# Patient Record
Sex: Male | Born: 1940 | Race: White | Hispanic: No | Marital: Single | Smoking: Never smoker
Health system: Southern US, Community
[De-identification: ages and names within clinical notes are randomized; demographics above are authoritative.]

## PROBLEM LIST (undated history)

## (undated) DIAGNOSIS — C61 Malignant neoplasm of prostate: Secondary | ICD-10-CM

## (undated) DIAGNOSIS — Z973 Presence of spectacles and contact lenses: Secondary | ICD-10-CM

## (undated) DIAGNOSIS — R399 Unspecified symptoms and signs involving the genitourinary system: Secondary | ICD-10-CM

## (undated) HISTORY — PX: PROSTATE BIOPSY: SHX241

## (undated) HISTORY — DX: Malignant neoplasm of prostate: C61

---

## 2012-06-04 HISTORY — PX: LAPAROSCOPIC INGUINAL HERNIA REPAIR: SUR788

## 2014-10-04 ENCOUNTER — Other Ambulatory Visit (HOSPITAL_COMMUNITY): Payer: Self-pay | Admitting: Urology

## 2014-10-04 DIAGNOSIS — C61 Malignant neoplasm of prostate: Secondary | ICD-10-CM

## 2014-10-12 ENCOUNTER — Encounter (HOSPITAL_COMMUNITY)
Admission: RE | Admit: 2014-10-12 | Discharge: 2014-10-12 | Disposition: A | Discharge status: Home / Self Care | Payer: Medicare Other | Source: Ambulatory Visit | Attending: Urology | Admitting: Urology

## 2014-10-12 DIAGNOSIS — C61 Malignant neoplasm of prostate: Secondary | ICD-10-CM | POA: Diagnosis present

## 2014-10-12 MED ORDER — TECHNETIUM TC 99M MEDRONATE IV KIT
26.5000 | PACK | Freq: Once | INTRAVENOUS | Status: AC | PRN
  Administered 2014-10-12: 26.5 via INTRAVENOUS

## 2014-10-26 ENCOUNTER — Other Ambulatory Visit (HOSPITAL_COMMUNITY): Payer: Self-pay | Admitting: Urology

## 2014-10-26 ENCOUNTER — Ambulatory Visit (HOSPITAL_COMMUNITY)
Admission: RE | Admit: 2014-10-26 | Discharge: 2014-10-26 | Disposition: A | Discharge status: Home / Self Care | Payer: Medicare Other | Source: Ambulatory Visit | Attending: Urology | Admitting: Urology

## 2014-10-26 DIAGNOSIS — C61 Malignant neoplasm of prostate: Secondary | ICD-10-CM

## 2014-11-09 ENCOUNTER — Encounter: Payer: Self-pay | Admitting: Radiation Oncology

## 2014-11-09 NOTE — Progress Notes (Signed)
GU Location of Tumor / Histology: prostatic adenocarcinoma  If Prostate Cancer, Gleason Score is (4 + 3) and PSA is (19.35)  Benjamin Fry presented referred by his PCP, Dr. Sabra Heck, on 09/18/2014 with an elevated PSA.  Biopsies of prostate (if applicable) revealed:    Past/Anticipated interventions by urology, if any: prostate biopsy and referral to radiation oncology  Past/Anticipated interventions by medical oncology, if any: no  Weight changes, if any: no  Bowel/Bladder complaints, if any: urinary frequency, feelings of urinary urgency, and nocturia   Nausea/Vomiting, if any: no  Pain issues, if any:  no  SAFETY ISSUES:  Prior radiation? no  Pacemaker/ICD? no  Possible current pregnancy? no  Is the patient on methotrexate? no  Current Complaints / other details:  74 year old male. Single with two sons. Denies taking any medication at this time. Prostate volume 47 cc.

## 2014-11-11 ENCOUNTER — Ambulatory Visit
Admission: RE | Admit: 2014-11-11 | Discharge: 2014-11-11 | Disposition: A | Discharge status: Home / Self Care | Payer: Medicare Other | Source: Ambulatory Visit | Attending: Radiation Oncology | Admitting: Radiation Oncology

## 2014-11-11 ENCOUNTER — Encounter: Payer: Self-pay | Admitting: Radiation Oncology

## 2014-11-11 VITALS — BP 139/68 | HR 76 | Temp 98.0°F | Resp 16 | Ht 72.0 in | Wt 205.3 lb

## 2014-11-11 DIAGNOSIS — Z823 Family history of stroke: Secondary | ICD-10-CM | POA: Insufficient documentation

## 2014-11-11 DIAGNOSIS — Z510 Encounter for antineoplastic radiation therapy: Secondary | ICD-10-CM | POA: Insufficient documentation

## 2014-11-11 DIAGNOSIS — Z8249 Family history of ischemic heart disease and other diseases of the circulatory system: Secondary | ICD-10-CM | POA: Insufficient documentation

## 2014-11-11 DIAGNOSIS — C61 Malignant neoplasm of prostate: Secondary | ICD-10-CM

## 2014-11-11 DIAGNOSIS — Z806 Family history of leukemia: Secondary | ICD-10-CM | POA: Insufficient documentation

## 2014-11-11 NOTE — Progress Notes (Signed)
Radiation Oncology         [phone] ________________________________  Initial Outpatient Consultation  Name: Benjamin Fry MRN: [id number]  Date: 11/11/2014  DOB: [birth year]  VO:[id number],IHKV F., MD  [name], MD   REFERRING PHYSICIAN: [name], MD  DIAGNOSIS: 74 y.o. gentleman with stage T1c adenocarcinoma of the prostate with a Gleason's score of 4+3 and a PSA of 19.35.   HISTORY OF PRESENT ILLNESS::Benjamin Fry is a 74 y.o. gentleman. This patient was initially dx with prostate cancer in [facility] in 10/14 with elevated PSA of 11.7. He had two core biopsy positive for Gleason 3+4 and 3+3. He decided on active surveillance. He relocated to [facility] and was noted to have an elevated PSA of 19.35 by his primary care physician, Dr. Sabra Heck.  Accordingly, he was referred for evaluation in urology by Dr. Karsten Ro.  A digital rectal examination was performed at that time revealing no nodules.  The patient proceeded to transrectal ultrasound with 12 biopsies of the prostate on 09/23/14.  The prostate volume measured 47.1 cc.  Out of 12 core biopsies, 3 were positive. The maximum Gleason score was 4+3, and this was seen in left medial and lateral ace, with Gleason 3+3 seen in the left lateral mid.   Bone scan as well as CT of the pelvis reveal no evidence of metastasis.  The patient reviewed the biopsy results and scan results with his urologist and he has kindly been referred today for discussion of potential radiation treatment options. Patient has received a Lupron injection May 2016.   PREVIOUS RADIATION THERAPY: NO  PAST MEDICAL HISTORY:  has a past medical history of Prostate cancer and Elevated prostate specific antigen (PSA).    PAST SURGICAL HISTORY: Past Surgical History  Procedure Laterality Date  . Prostate biopsy    . Laparoscopy repair of initial inguinal hernia      FAMILY HISTORY: family history includes CVA in his mother; Heart attack in his father; Leukemia in his  father.  SOCIAL HISTORY:  reports that he has never smoked. He has never used smokeless tobacco. He reports that he drinks alcohol.  ALLERGIES: Review of patient's allergies indicates no known allergies.  MEDICATIONS:  Current Outpatient Prescriptions  Medication Sig Dispense Refill  . naproxen sodium (ANAPROX) 220 MG tablet Take 220 mg by mouth 2 (two) times daily with a meal.     No current facility-administered medications for this encounter.    REVIEW OF SYSTEMS:  A 15 point review of systems is documented in the electronic medical record. This was obtained by the nursing staff. However, I reviewed this with the patient to discuss relevant findings and make appropriate changes.  A comprehensive review of systems was negative..  The patient completed an IPSS and IIEF questionnaire.  His IPSS score was 11 indicating moderate urinary outflow obstructive symptoms.  He indicated that his erectile function is almost always to complete sexual activity.Colonoscopy schedule for December 17, 2014.  IPSS score of 11 with urgency, weak stream and nocturia two times per night.  He has just moved from [facility].  He denies having any pain.  He is interested in having seeds placed. Marland Kitchen    PHYSICAL EXAM: This patient is in no acute distress.  He is alert and oriented.  BP 139/68 mmHg  Pulse 76  Temp(Src) 98 F (36.7 C) (Oral)  Resp 16  Ht 6' (1.829 m)  Wt 205 lb 4.8 oz (93.123 kg)  BMI 27.84 kg/m2 He exhibits no respiratory  distress or labored breathing.  He appears neurologically intact.  His mood is pleasant.  His affect is appropriate.  Please note the digital rectal exam findings described above.  KPS = 100  100 - Normal; no complaints; no evidence of disease. 90   - Able to carry on normal activity; minor signs or symptoms of disease. 80   - Normal activity with effort; some signs or symptoms of disease. 23   - Cares for self; unable to carry on normal activity or to do active work. 60   -  Requires occasional assistance, but is able to care for most of his personal needs. 50   - Requires considerable assistance and frequent medical care. 29   - Disabled; requires special care and assistance. 65   - Severely disabled; hospital admission is indicated although death not imminent. 80   - Very sick; hospital admission necessary; active supportive treatment necessary. 10   - Moribund; fatal processes progressing rapidly. 0     - Dead  Karnofsky DA, Abelmann WH, Craver LS and Burchenal JH [phone]) The use of the nitrogen mustards in the palliative treatment of carcinoma: with particular reference to bronchogenic carcinoma Cancer 1 634-56   LABORATORY DATA:  No results found for: WBC, HGB, HCT, MCV, PLT No results found for: NA, K, CL, CO2 No results found for: ALT, AST, GGT, ALKPHOS, BILITOT   RADIOGRAPHY: Dg Cervical Spine 2 Or 3 Views  10/26/2014   CLINICAL DATA:  Prostate neoplasm.  EXAM: CERVICAL SPINE - 2-3 VIEW  COMPARISON:  Bone scan 10/12/2014.  FINDINGS: Diffuse multilevel degenerative changes noted throughout the cervical spine. This most likely accounts for abnormal bone scan activity. No acute or focal lesion identified.  IMPRESSION: Diffuse cervical spine degenerative change. This most likely accounts for abnormal bone scan activity on bone scan of 10/12/2014.   Electronically Signed   By: Marcello Moores  Register   On: 10/26/2014 13:55   Dg Thoracic Spine 2 View  10/26/2014   CLINICAL DATA:  Prostate neoplasm.  EXAM: THORACIC SPINE - 2 VIEW  COMPARISON:  Bone scan 10/12/2014.  FINDINGS: Diffuse multilevel degenerative change noted throughout the thoracic spine. Pedicles are intact. No focal or acute abnormality identified.  IMPRESSION: Diffuse degenerative changes thoracic spine. This most likely accounts for abnormal bone scan activity on recent bone scan of 10/12/2014.   Electronically Signed   By: Marcello Moores  Register   On: 10/26/2014 13:56   Dg Lumbar Spine 2-3 Views  10/26/2014    CLINICAL DATA:  Prostate carcinoma.  EXAM: LUMBAR SPINE - 2-3 VIEW  COMPARISON:  Whole-body bone scan Oct 12, 2014  FINDINGS: Frontal and lateral views were obtained. There are 5 non-rib-bearing lumbar type vertebral bodies. There is no fracture or spondylolisthesis. There is moderately severe disc space narrowing at L5-S1. There is moderate narrowing at all other levels. There are anterior osteophytes at all levels. There are no blastic or lytic bone lesions.  IMPRESSION: Multilevel osteoarthritic change. No blastic or lytic bone lesions. No fracture or spondylolisthesis.   Electronically Signed   By: Lowella Grip III M.D.   On: 10/26/2014 13:57      IMPRESSION: This gentleman is a nice stage T1c adenocarcinoma of the prostate with a Gleason's score of 4+3 and a PSA of 19.35. His T-Stage, Gleason's Score, and PSA put him into the intermediate risk group.  Accordingly he is eligible for a variety of potential treatment options including androgen deprivation with radiotherapy +/- seed implant.  PLAN: Today  I reviewed the findings and workup thus far.  We discussed the natural history of prostate cancer.  We reviewed the the implications of T-stage, Gleason's Score, and PSA on decision-making and outcomes in prostate cancer.  We discussed radiation treatment in the management of prostate cancer with regard to the logistics and delivery of external beam radiation treatment as well as the logistics and delivery of prostate brachytherapy.  We compared and contrasted each of these approaches and also compared these against prostatectomy.  The patient expressed interest in external beam radiotherapy.  I filled out a patient counseling form for him with relevant treatment diagrams and we retained a copy for our records.   The patient would like to proceed with prostate external radiation followed by prostate seed implant boost.  I will share my findings with Dr. Karsten Ro and move forward with scheduling placement  of three gold fiducial markers into the prostate in late July to proceed with external radiation following 2 months of neo-adjuvant AndroGen and deprivation. External beam radiation treatment will be followed by prostate seed implant boost.  I enjoyed meeting with him today, and will look forward to participating in the care of this very nice gentleman.  This document serves as a record of services personally performed by Tyler Pita, MD. It was created on his behalf by Jeralene Peters, a trained medical scribe. The creation of this record is based on the scribe's personal observations and the provider's statements to them. This document has been checked and approved by the attending provider.     I spent 60 minutes face to face with the patient and more than 50% of that time was spent in counseling and/or coordination of care.    ------------------------------------------------  Sheral Apley. Tammi Klippel, M.D.

## 2014-11-11 NOTE — Progress Notes (Addendum)
IPSS score of 11 with urgency, weak stream and nocturia two times per night.  He has just moved from [facility].  He denies having any pain.  He is interested in having seeds placed.  BP 139/68 mmHg  Pulse 76  Temp(Src) 98 F (36.7 C) (Oral)  Resp 16  Ht 6' (1.829 m)  Wt 205 lb 4.8 oz (93.123 kg)  BMI 27.84 kg/m2

## 2014-11-11 NOTE — Progress Notes (Signed)
Please see the Nurse Progress Note in the MD Initial Consult Encounter for this patient. 

## 2014-11-12 ENCOUNTER — Telehealth: Payer: Self-pay | Admitting: Radiation Oncology

## 2014-11-12 NOTE — Telephone Encounter (Signed)
-----   Message from Tyler Pita, MD sent at 11/12/2014 12:34 PM EDT ----- Regarding: RE: Patient confused on time frame Contact: [phone] Sam,  Can you call and clarify 8 weeks from Lupron (5/26) to start radiation, but, simulation is a week or so before radiation and gold markers are 2-3 days before that.  With Lupron going, the disease is dormant, so, longer intervals are OK.  But, we try not to have a shorter interval than 8 weeks from Lupron to radiation.  Also, about 3 week delay between external radiation and seed implant.  MM     ----- Message -----    From: Hollace Kinnier    Sent: 11/12/2014   8:15 AM      To: Tyler Pita, MD Subject: Patient confused on time frame                 Good morning,  Joelyn Oms called first thing this morning saying that he had a discrepency in his notes on if you told him yesterday that there was a five week period vs. Six week period of waiting. I looked at your consult note, but could not find any answer to his question on the gold seeds.  His number is [phone] if you would like for me to call him with an answer to his question, I'll be glad to.  Cecille Rubin

## 2014-11-12 NOTE — Telephone Encounter (Signed)
Per Dr. Johny Shears order this RN called the patient to clarify. Explained that radiation will start 8 weeks following Lupron (which he received on 5/26) but, simulation is a week or so before and gold markers are 2-3 days before that. Went on to explain that the disease is dormant so longer intervals are OK but, we try not to have shorter intervals than 8 weeks from Lupron to radiation. Also, explained the interval from external radiation and seeds implants is about three weeks. Patient very talkative. Reassured patient his "treatments" would be scheduled appropriately and his care well managed. Patient verbalized understanding.

## 2014-11-15 ENCOUNTER — Telehealth: Payer: Self-pay | Admitting: *Deleted

## 2014-11-15 NOTE — Telephone Encounter (Signed)
Called patient to inform of gold seed placement on 12-30-14- arrival time - 1:45 pm @ Dr. Simone Curia Office and his sim on 01-14-15 @ 2 pm @ Dr. Johny Shears Office, spoke with patient and he is aware of these appts.

## 2014-11-16 ENCOUNTER — Telehealth: Payer: Self-pay | Admitting: *Deleted

## 2014-11-16 NOTE — Telephone Encounter (Signed)
CALLED PATIENT TO INFORM THAT HE WILL START RT ON 01-25-15 PER SIM, LVM FOR A RETURN CALL

## 2014-12-09 ENCOUNTER — Telehealth: Payer: Self-pay | Admitting: *Deleted

## 2014-12-09 NOTE — Telephone Encounter (Signed)
Called patient to inform of new date for gold seeds on 01/12/15 - arrival time - 9:45 am  @ the urologist's office, lvm for a return call

## 2015-01-07 ENCOUNTER — Encounter: Payer: Self-pay | Admitting: *Deleted

## 2015-01-10 ENCOUNTER — Ambulatory Visit: Payer: Medicare Other | Admitting: Anesthesiology

## 2015-01-10 ENCOUNTER — Encounter
Admission: RE | Disposition: A | Discharge status: Home / Self Care | Payer: Self-pay | Source: Ambulatory Visit | Attending: Unknown Physician Specialty

## 2015-01-10 ENCOUNTER — Ambulatory Visit
Admission: RE | Admit: 2015-01-10 | Discharge: 2015-01-10 | Disposition: A | Discharge status: Home / Self Care | Payer: Medicare Other | Source: Ambulatory Visit | Attending: Unknown Physician Specialty | Admitting: Unknown Physician Specialty

## 2015-01-10 DIAGNOSIS — Z8546 Personal history of malignant neoplasm of prostate: Secondary | ICD-10-CM | POA: Insufficient documentation

## 2015-01-10 DIAGNOSIS — K5730 Diverticulosis of large intestine without perforation or abscess without bleeding: Secondary | ICD-10-CM | POA: Diagnosis not present

## 2015-01-10 DIAGNOSIS — D120 Benign neoplasm of cecum: Secondary | ICD-10-CM | POA: Insufficient documentation

## 2015-01-10 DIAGNOSIS — K648 Other hemorrhoids: Secondary | ICD-10-CM | POA: Insufficient documentation

## 2015-01-10 DIAGNOSIS — Z1211 Encounter for screening for malignant neoplasm of colon: Secondary | ICD-10-CM | POA: Insufficient documentation

## 2015-01-10 HISTORY — PX: COLONOSCOPY WITH PROPOFOL: SHX5780

## 2015-01-10 SURGERY — COLONOSCOPY WITH PROPOFOL
Anesthesia: General

## 2015-01-10 MED ORDER — FENTANYL CITRATE (PF) 100 MCG/2ML IJ SOLN
INTRAMUSCULAR | Status: DC | PRN
  Administered 2015-01-10: 50 ug via INTRAVENOUS

## 2015-01-10 MED ORDER — LIDOCAINE HCL (PF) 2 % IJ SOLN
INTRAMUSCULAR | Status: DC | PRN
  Administered 2015-01-10: 50 mg

## 2015-01-10 MED ORDER — PROPOFOL 10 MG/ML IV BOLUS
INTRAVENOUS | Status: DC | PRN
  Administered 2015-01-10: 50 mg via INTRAVENOUS

## 2015-01-10 MED ORDER — PROPOFOL INFUSION 10 MG/ML OPTIME
INTRAVENOUS | Status: DC | PRN
  Administered 2015-01-10: 100 ug/kg/min via INTRAVENOUS

## 2015-01-10 MED ORDER — SODIUM CHLORIDE 0.9 % IV SOLN
INTRAVENOUS | Status: DC
  Administered 2015-01-10: 12:00:00 via INTRAVENOUS

## 2015-01-10 MED ORDER — SODIUM CHLORIDE 0.9 % IV SOLN
INTRAVENOUS | Status: DC
  Administered 2015-01-10: 1000 mL via INTRAVENOUS

## 2015-01-10 NOTE — H&P (Signed)
Primary Care Physician:  [name], MD Primary Gastroenterologist:  Dr. Vira Agar  Pre-Procedure History & Physical: HPI:  Benjamin Fry is a 74 y.o. male is here for an colonoscopy.   Past Medical History  Diagnosis Date  . Prostate cancer   . Elevated prostate specific antigen (PSA)   . Prostate cancer     Past Surgical History  Procedure Laterality Date  . Prostate biopsy    . Laparoscopy repair of initial inguinal hernia    . Hernia repair      Prior to Admission medications   Medication Sig Start Date End Date Taking? Authorizing Provider  naproxen sodium (ANAPROX) 220 MG tablet Take 220 mg by mouth 2 (two) times daily with a meal.    Historical Provider, MD    Allergies as of 11/24/2014  . (No Known Allergies)    Family History  Problem Relation Age of Onset  . CVA Mother   . Heart attack Father   . Leukemia Father     History   Social History  . Marital Status: Single    Spouse Name: N/A  . Number of Children: 2  . Years of Education: N/A   Occupational History  . Radio and TV    Social History Main Topics  . Smoking status: Never Smoker   . Smokeless tobacco: Never Used  . Alcohol Use: Yes     Comment: 2 per month  . Drug Use: Not on file  . Sexual Activity: No   Other Topics Concern  . Not on file   Social History Narrative    Review of Systems: See HPI, otherwise negative ROS  Physical Exam: BP 117/77 mmHg  Pulse 94  Temp(Src) 97.4 F (36.3 C) (Tympanic)  Resp 16  Ht 6' (1.829 m)  Wt 86.183 kg (190 lb)  BMI 25.76 kg/m2  SpO2 99% General:   Alert,  pleasant and cooperative in NAD Head:  Normocephalic and atraumatic. Neck:  Supple; no masses or thyromegaly. Lungs:  Clear throughout to auscultation.    Heart:  Regular rate and rhythm. Abdomen:  Soft, nontender and nondistended. Normal bowel sounds, without guarding, and without rebound.   Neurologic:  Alert and  oriented x4;  grossly normal  neurologically.  Impression/Plan: Oneil Behney is here for an colonoscopy to be performed for screening  Risks, benefits, limitations, and alternatives regarding  colonoscopy have been reviewed with the patient.  Questions have been answered.  All parties agreeable.   Gaylyn Cheers, MD  01/10/2015, 11:42 AM   Primary Care Physician:  [name], MD Primary Gastroenterologist:  Dr. Vira Agar  Pre-Procedure History & Physical: HPI:  Benjamin Fry is a 74 y.o. male is here for an colonoscopy.   Past Medical History  Diagnosis Date  . Prostate cancer   . Elevated prostate specific antigen (PSA)   . Prostate cancer     Past Surgical History  Procedure Laterality Date  . Prostate biopsy    . Laparoscopy repair of initial inguinal hernia    . Hernia repair      Prior to Admission medications   Medication Sig Start Date End Date Taking? Authorizing Provider  naproxen sodium (ANAPROX) 220 MG tablet Take 220 mg by mouth 2 (two) times daily with a meal.    Historical Provider, MD    Allergies as of 11/24/2014  . (No Known Allergies)    Family History  Problem Relation Age of Onset  . CVA Mother   . Heart attack Father   .  Leukemia Father     History   Social History  . Marital Status: Single    Spouse Name: N/A  . Number of Children: 2  . Years of Education: N/A   Occupational History  . Radio and TV    Social History Main Topics  . Smoking status: Never Smoker   . Smokeless tobacco: Never Used  . Alcohol Use: Yes     Comment: 2 per month  . Drug Use: Not on file  . Sexual Activity: No   Other Topics Concern  . Not on file   Social History Narrative    Review of Systems: See HPI, otherwise negative ROS  Physical Exam: BP 117/77 mmHg  Pulse 94  Temp(Src) 97.4 F (36.3 C) (Tympanic)  Resp 16  Ht 6' (1.829 m)  Wt 86.183 kg (190 lb)  BMI 25.76 kg/m2  SpO2 99% General:   Alert,  pleasant and cooperative in NAD Head:  Normocephalic and  atraumatic. Neck:  Supple; no masses or thyromegaly. Lungs:  Clear throughout to auscultation.    Heart:  Regular rate and rhythm. Abdomen:  Soft, nontender and nondistended. Normal bowel sounds, without guarding, and without rebound.   Neurologic:  Alert and  oriented x4;  grossly normal neurologically.  Impression/Plan: Benjamin Fry is here for an colonoscopy to be performed for screening  Risks, benefits, limitations, and alternatives regarding  colonoscopy have been reviewed with the patient.  Questions have been answered.  All parties agreeable.   Gaylyn Cheers, MD  01/10/2015, 11:42 AM

## 2015-01-10 NOTE — Anesthesia Postprocedure Evaluation (Signed)
  Anesthesia Post-op Note  Patient: Benjamin Fry  Procedure(s) Performed: Procedure(s): COLONOSCOPY WITH PROPOFOL (N/A)  Anesthesia type:General  Patient location: PACU  Post pain: Pain level controlled  Post assessment: Post-op Vital signs reviewed, Patient's Cardiovascular Status Stable, Respiratory Function Stable, Patent Airway and No signs of Nausea or vomiting  Post vital signs: Reviewed and stable  Last Vitals:  Filed Vitals:   01/10/15 1250  BP: 114/71  Pulse: 64  Temp:   Resp: 14    Level of consciousness: awake, alert  and patient cooperative  Complications: No apparent anesthesia complications

## 2015-01-10 NOTE — Anesthesia Preprocedure Evaluation (Addendum)
Anesthesia Evaluation  Patient identified by MRN, date of birth, ID band Patient awake    Reviewed: Allergy & Precautions, H&P , NPO status , Patient's Chart, lab work & pertinent test results, reviewed documented beta blocker date and time   History of Anesthesia Complications Negative for: history of anesthetic complications  Airway Mallampati: II  TM Distance: >3 FB Neck ROM: full    Dental no notable dental hx.    Pulmonary neg pulmonary ROS,  breath sounds clear to auscultation  Pulmonary exam normal       Cardiovascular Exercise Tolerance: Good negative cardio ROS Normal cardiovascular examRhythm:regular Rate:Normal     Neuro/Psych negative neurological ROS  negative psych ROS   GI/Hepatic negative GI ROS, Neg liver ROS,   Endo/Other  negative endocrine ROS  Renal/GU negative Renal ROS  negative genitourinary   Musculoskeletal   Abdominal   Peds  Hematology negative hematology ROS (+)   Anesthesia Other Findings Past Medical History:   Prostate cancer                                              Elevated prostate specific antigen (PSA)                     Prostate cancer                                              Reproductive/Obstetrics negative OB ROS                            Anesthesia Physical Anesthesia Plan  ASA: I  Anesthesia Plan: General   Post-op Pain Management:    Induction:   Airway Management Planned:   Additional Equipment:   Intra-op Plan:   Post-operative Plan:   Informed Consent: I have reviewed the patients History and Physical, chart, labs and discussed the procedure including the risks, benefits and alternatives for the proposed anesthesia with the patient or authorized representative who has indicated his/her understanding and acceptance.   Dental Advisory Given  Plan Discussed with: Anesthesiologist, CRNA and Surgeon  Anesthesia Plan  Comments:         Anesthesia Quick Evaluation

## 2015-01-10 NOTE — Op Note (Addendum)
[facility] Gastroenterology Patient Name: Benjamin Fry Procedure Date: 01/10/2015 11:41 AM MRN: [id number] Account #: [id number] Date of Birth: 1940/11/12 Admit Type: Outpatient Age: 74 Room: [facility] ENDO ROOM 1 Gender: Male Note Status: Supervisor Override Procedure:         Colonoscopy Indications:       Screening for colorectal malignant neoplasm Providers:         Manya Silvas, MD Referring MD:      Rusty Aus, MD (Referring MD) Medicines:         Propofol per Anesthesia Complications:     No immediate complications. Procedure:         Pre-Anesthesia Assessment:                    - After reviewing the risks and benefits, the patient was                     deemed in satisfactory condition to undergo the procedure.                    After obtaining informed consent, the colonoscope was                     passed under direct vision. Throughout the procedure, the                     patient's blood pressure, pulse, and oxygen saturations                     were monitored continuously. The Colonoscope was                     introduced through the anus and advanced to the the cecum,                     identified by appendiceal orifice and ileocecal valve. The                     colonoscopy was performed without difficulty. The patient                     tolerated the procedure well. The quality of the bowel                     preparation was good. Findings:      A diminutive polyp was found in the cecum. The polyp was sessile. The       polyp was removed with a jumbo cold forceps. Resection and retrieval       were complete.      A few small-mouthed diverticula were found in the sigmoid colon.      Internal hemorrhoids were found during endoscopy. The hemorrhoids were       medium-sized and Grade I (internal hemorrhoids that do not prolapse).      The exam was otherwise without abnormality. Impression:        - One diminutive polyp in the cecum.  Resected and                     retrieved.                    - Diverticulosis in the sigmoid colon.                    -  Internal hemorrhoids.                    - The examination was otherwise normal. Recommendation:    - Await pathology results. Manya Silvas, MD 01/10/2015 12:12:14 PM This report has been signed electronically. Number of Addenda: 0 Note Initiated On: 01/10/2015 11:41 AM Scope Withdrawal Time: 0 hours 13 minutes 45 seconds  Total Procedure Duration: 0 hours 19 minutes 33 seconds       [facility]

## 2015-01-10 NOTE — Transfer of Care (Signed)
Immediate Anesthesia Transfer of Care Note  Patient: Benjamin Fry  Procedure(s) Performed: Procedure(s): COLONOSCOPY WITH PROPOFOL (N/A)  Patient Location: PACU  Anesthesia Type:General  Level of Consciousness: sedated  Airway & Oxygen Therapy: Patient Spontanous Breathing and Patient connected to nasal cannula oxygen  Post-op Assessment: Report given to RN and Post -op Vital signs reviewed and stable  Post vital signs: Reviewed and stable  Last Vitals:  Filed Vitals:   01/10/15 1043  BP: 117/77  Pulse: 94  Temp: 36.3 C  Resp: 16    Complications: No apparent anesthesia complications

## 2015-01-11 ENCOUNTER — Encounter: Payer: Self-pay | Admitting: Unknown Physician Specialty

## 2015-01-11 LAB — SURGICAL PATHOLOGY

## 2015-01-14 ENCOUNTER — Ambulatory Visit
Admission: RE | Admit: 2015-01-14 | Discharge: 2015-01-14 | Disposition: A | Discharge status: Home / Self Care | Payer: Medicare Other | Source: Ambulatory Visit | Attending: Radiation Oncology | Admitting: Radiation Oncology

## 2015-01-14 DIAGNOSIS — C61 Malignant neoplasm of prostate: Secondary | ICD-10-CM

## 2015-01-14 DIAGNOSIS — Z510 Encounter for antineoplastic radiation therapy: Secondary | ICD-10-CM | POA: Diagnosis not present

## 2015-01-14 NOTE — Progress Notes (Signed)
  Radiation Oncology         [phone]) [phone] ________________________________  Name: Benjamin Fry  MRN: [id number]  Date: 01/14/2015  DOB: 05/15/1941  SIMULATION AND TREATMENT PLANNING NOTE    ICD-9-CM ICD-10-CM   1. Malignant neoplasm of prostate 185 C61     DIAGNOSIS:  74 y.o. gentleman with stage T1c adenocarcinoma of the prostate with a Gleason's score of 4+3 and a PSA of 19.35.  NARRATIVE:  The patient was brought to the [facility].  Identity was confirmed.  All relevant records and images related to the planned course of therapy were reviewed.  The patient freely provided informed written consent to proceed with treatment after reviewing the details related to the planned course of therapy. The consent form was witnessed and verified by the simulation staff.  Then, the patient was set-up in a stable reproducible supine position for radiation therapy.  A vacuum lock pillow device was custom fabricated to position his legs in a reproducible immobilized position.  Then, I performed a urethrogram under sterile conditions to identify the prostatic apex.  CT images were obtained.  Surface markings were placed.  The CT images were loaded into the planning software.  Then the prostate target and avoidance structures including the rectum, bladder, bowel and hips were contoured.  Treatment planning then occurred.  The radiation prescription was entered and confirmed.  A total of 5 complex treatment devices were fabricated including leg mold and 4 MLCs shielding critical structures. I have requested : 3D Simulation  I have requested a DVH of the following structures: rt hip, lt hip, bladder, small bowel, rectum and prostate.  Pubic Arch Study:  The patient was also under evaluation for possible prostate seed implant. His 3-dimensional image study set was obtained in upload to the planning computer. There, on each axial slice, I contoured the prostate gland. Then, using three-dimensional  radiation planning tools I reconstructed the prostate in view of the structures from the transperineal needle pathway to assess for possible pubic arch interference. In doing so, I did not appreciate any pubic arch interference. Also, the patient's prostate volume was estimated based on the drawn structure. The volume was 38 cc correlating with his TRUS volume of 47 cc.  Given the pubic arch appearance and prostate volume, patient remains a good candidate to proceed with prostate seed implant. Today, he freely provided informed written consent to proceed.     PLAN:  The patient will receive 45 Gy in 25 fractions followed by seed implant boost.  This document serves as a record of services personally performed by Tyler Pita, MD. It was created on his behalf by Arlyce Harman, a trained medical scribe. The creation of this record is based on the scribe's personal observations and the provider's statements to them. This document has been checked and approved by the attending provider.    ________________________________  Sheral Apley. Tammi Klippel, M.D.

## 2015-01-17 ENCOUNTER — Telehealth: Payer: Self-pay | Admitting: Radiation Oncology

## 2015-01-17 NOTE — Telephone Encounter (Signed)
Per Dr. Johny Shears order phoned patient to inquire about his questions. Patient is scheduled to begin receiving 25 radiation treatments a week from tomorrow. Patient questions if he would be able to miss treatment September 13,14,15 to go to the [facility] and September 22 and 23 to go to [place], [place]. Explained this RN will ask Dr. Tammi Klippel and return his call. Patient expressed appreciation.

## 2015-01-17 NOTE — Telephone Encounter (Signed)
-----   Message from Tyler Pita, MD sent at 01/17/2015 11:16 AM EDT ----- Regarding: RE: PHONE CALL Tried to call, no answer.  Sam can you help find out what is going on?  MM   ----- Message -----    From: Kerri Perches    Sent: 01/17/2015  10:43 AM      To: Tyler Pita, MD Subject: PHONE CALL                                     Good Morning Dr. Tammi Klippel,   This patient of yours called and he has a question for you.  Would you please call Mr. Bembenek @ [phone] when you get a chance.  Thanks,  [employer]

## 2015-01-17 NOTE — Telephone Encounter (Signed)
Benjamin Fry, He is receiving androgen deprivation, so a few brief interruptions are OK. MM

## 2015-01-18 ENCOUNTER — Telehealth: Payer: Self-pay | Admitting: Radiation Oncology

## 2015-01-18 NOTE — Telephone Encounter (Signed)
Phoned patient per Dr. Johny Shears order. Explained that since he is receiving androgen deprivation a few brief interruptions are OK. Patient expressed understanding. Forwarded message onto [facility] to make them aware that patient will not be present for treatment on September 13, 14, 15, 22, and 23.

## 2015-01-20 DIAGNOSIS — Z510 Encounter for antineoplastic radiation therapy: Secondary | ICD-10-CM | POA: Diagnosis not present

## 2015-01-23 DIAGNOSIS — Z510 Encounter for antineoplastic radiation therapy: Secondary | ICD-10-CM | POA: Diagnosis not present

## 2015-01-25 ENCOUNTER — Ambulatory Visit
Admission: RE | Admit: 2015-01-25 | Discharge: 2015-01-25 | Disposition: A | Discharge status: Home / Self Care | Payer: Medicare Other | Source: Ambulatory Visit | Attending: Radiation Oncology | Admitting: Radiation Oncology

## 2015-01-25 DIAGNOSIS — Z510 Encounter for antineoplastic radiation therapy: Secondary | ICD-10-CM | POA: Diagnosis not present

## 2015-01-26 ENCOUNTER — Ambulatory Visit
Admission: RE | Admit: 2015-01-26 | Discharge: 2015-01-26 | Disposition: A | Discharge status: Home / Self Care | Payer: Medicare Other | Source: Ambulatory Visit | Attending: Radiation Oncology | Admitting: Radiation Oncology

## 2015-01-26 DIAGNOSIS — Z510 Encounter for antineoplastic radiation therapy: Secondary | ICD-10-CM | POA: Diagnosis not present

## 2015-01-27 ENCOUNTER — Ambulatory Visit
Admission: RE | Admit: 2015-01-27 | Discharge: 2015-01-27 | Disposition: A | Discharge status: Home / Self Care | Payer: Medicare Other | Source: Ambulatory Visit | Attending: Radiation Oncology | Admitting: Radiation Oncology

## 2015-01-27 DIAGNOSIS — Z510 Encounter for antineoplastic radiation therapy: Secondary | ICD-10-CM | POA: Diagnosis not present

## 2015-01-28 ENCOUNTER — Ambulatory Visit
Admission: RE | Admit: 2015-01-28 | Discharge: 2015-01-28 | Disposition: A | Discharge status: Home / Self Care | Payer: Medicare Other | Source: Ambulatory Visit | Attending: Radiation Oncology | Admitting: Radiation Oncology

## 2015-01-28 ENCOUNTER — Encounter: Payer: Self-pay | Admitting: Radiation Oncology

## 2015-01-28 VITALS — BP 146/82 | HR 82 | Resp 16 | Wt 206.4 lb

## 2015-01-28 DIAGNOSIS — C61 Malignant neoplasm of prostate: Secondary | ICD-10-CM

## 2015-01-28 DIAGNOSIS — Z510 Encounter for antineoplastic radiation therapy: Secondary | ICD-10-CM | POA: Diagnosis not present

## 2015-01-28 NOTE — Addendum Note (Signed)
Encounter addended by: Heywood Footman, RN on: 01/28/2015  2:45 PM<BR>     Documentation filed: Notes Section, Inpatient Patient Education

## 2015-01-28 NOTE — Progress Notes (Addendum)
Weight and vitals stable. Denies pain. Patient without complaints. Denies frequency, urgency, incontinence, dysuria, hematuria or diarrhea. Oriented patient to staff and routine of the clinic. Provided patient with RADIATION THERAPY AND YOU handbook then, reviewed pertinent information. Educated patient reference potential side effects and management such as fatigue, skin changes, diarrhea and urinary/bladder changes. Answered all questions to the best of my ability.  Provided patient with my business card and encouraged them to call with future needs. Patient verbalized understanding of all reviewed.  BP 146/82 mmHg  Pulse 82  Resp 16  Wt 206 lb 6.4 oz (93.622 kg) Wt Readings from Last 3 Encounters:  01/28/15 206 lb 6.4 oz (93.622 kg)  01/10/15 190 lb (86.183 kg)  11/11/14 205 lb 4.8 oz (93.123 kg)

## 2015-01-28 NOTE — Progress Notes (Signed)
  Radiation Oncology         [phone]   Name: Benjamin Fry MRN: [id number]   Date: 01/28/2015  DOB: 1940/11/11     Weekly Radiation Therapy Management    ICD-9-CM ICD-10-CM   1. Malignant neoplasm of prostate 185 C61     Current Dose: 7.2 Gy  Planned Dose:  45 Gy plus Seed Implant  Narrative The patient presents for routine under treatment assessment. Weight and vitals stable. Denies pain. Patient without complaints. Denies frequency, urgency, incontinence, dysuria, hematuria or diarrhea.  The patient is without complaint. Set-up films were reviewed. The chart was checked.  Physical Findings  weight is 206 lb 6.4 oz (93.622 kg). His blood pressure is 146/82 and his pulse is 82. His respiration is 16. . Weight essentially stable.  No significant changes.  Impression The patient is tolerating radiation.  Plan Continue treatment as planned.    This document serves as a record of services personally performed by Tyler Pita, MD. It was created on his behalf by Janace Hoard, a trained medical scribe. The creation of this record is based on the scribe's personal observations and the provider's statements to them. This document has been checked and approved by the attending provider.        Sheral Apley Tammi Klippel, M.D.

## 2015-01-31 ENCOUNTER — Ambulatory Visit
Admission: RE | Admit: 2015-01-31 | Discharge: 2015-01-31 | Disposition: A | Discharge status: Home / Self Care | Payer: Medicare Other | Source: Ambulatory Visit | Attending: Radiation Oncology | Admitting: Radiation Oncology

## 2015-01-31 DIAGNOSIS — Z510 Encounter for antineoplastic radiation therapy: Secondary | ICD-10-CM | POA: Diagnosis not present

## 2015-02-01 ENCOUNTER — Ambulatory Visit
Admission: RE | Admit: 2015-02-01 | Discharge: 2015-02-01 | Disposition: A | Discharge status: Home / Self Care | Payer: Medicare Other | Source: Ambulatory Visit | Attending: Radiation Oncology | Admitting: Radiation Oncology

## 2015-02-01 DIAGNOSIS — Z510 Encounter for antineoplastic radiation therapy: Secondary | ICD-10-CM | POA: Diagnosis not present

## 2015-02-02 ENCOUNTER — Ambulatory Visit
Admission: RE | Admit: 2015-02-02 | Discharge: 2015-02-02 | Disposition: A | Discharge status: Home / Self Care | Payer: Medicare Other | Source: Ambulatory Visit | Attending: Radiation Oncology | Admitting: Radiation Oncology

## 2015-02-02 DIAGNOSIS — Z510 Encounter for antineoplastic radiation therapy: Secondary | ICD-10-CM | POA: Diagnosis not present

## 2015-02-03 ENCOUNTER — Telehealth: Payer: Self-pay | Admitting: Radiation Oncology

## 2015-02-03 ENCOUNTER — Ambulatory Visit
Admission: RE | Admit: 2015-02-03 | Discharge: 2015-02-03 | Disposition: A | Discharge status: Home / Self Care | Payer: Medicare Other | Source: Ambulatory Visit | Attending: Radiation Oncology | Admitting: Radiation Oncology

## 2015-02-03 DIAGNOSIS — Z510 Encounter for antineoplastic radiation therapy: Secondary | ICD-10-CM | POA: Diagnosis not present

## 2015-02-03 DIAGNOSIS — C61 Malignant neoplasm of prostate: Secondary | ICD-10-CM | POA: Insufficient documentation

## 2015-02-03 NOTE — Telephone Encounter (Signed)
Informed by Romie Jumper that the patient has questions. Phoned patient to inquire. Patient questions if he receives his radiation treatment earlier than his scheduled time if his PUT appointment will also be bumped up. Confirmed that yes this will be the case. Then, patient questions if Dr. Tammi Klippel would allow him to be treatment twice a day because he understands from the therapist that there has to be six hours between each treatment. Explained this is a question for Dr. Tammi Klippel that he will be glad to answer tomorrow during their PUT visit. Patient verbalized understanding and expressed appreciation for the call.

## 2015-02-04 ENCOUNTER — Encounter: Payer: Self-pay | Admitting: Radiation Oncology

## 2015-02-04 ENCOUNTER — Ambulatory Visit
Admission: RE | Admit: 2015-02-04 | Discharge: 2015-02-04 | Disposition: A | Discharge status: Home / Self Care | Payer: Medicare Other | Source: Ambulatory Visit | Attending: Radiation Oncology | Admitting: Radiation Oncology

## 2015-02-04 VITALS — BP 148/87 | HR 88 | Resp 16 | Wt 203.1 lb

## 2015-02-04 DIAGNOSIS — Z510 Encounter for antineoplastic radiation therapy: Secondary | ICD-10-CM | POA: Diagnosis not present

## 2015-02-04 DIAGNOSIS — C61 Malignant neoplasm of prostate: Secondary | ICD-10-CM

## 2015-02-04 NOTE — Progress Notes (Addendum)
Weight and vitals stable. Denies pain. Reports he voided every hour last night. Denies dysuria or hematuria. Reports urinary urgency. Denies diarrhea. Denies fatigue. Patient anxious to speak with Dr. Tammi Klippel about bid treatments.  BP 148/87 mmHg  Pulse 88  Resp 16  Wt 203 lb 1.6 oz (92.126 kg) Wt Readings from Last 3 Encounters:  02/04/15 203 lb 1.6 oz (92.126 kg)  01/28/15 206 lb 6.4 oz (93.622 kg)  01/10/15 190 lb (86.183 kg)

## 2015-02-04 NOTE — Progress Notes (Signed)
  Radiation Oncology         [phone]   Name: Benjamin Fry MRN: [id number]   Date: 02/04/2015  DOB: [birth year]     Weekly Radiation Therapy Management    ICD-9-CM ICD-10-CM   1. Malignant neoplasm of prostate 185 C61     Current Dose: 16.2 Gy  Planned Dose:  45 Gy plus Seed Implant  Narrative The patient presents for routine under treatment assessment. Weight and vitals stable. Denies pain. Reports he voided every hour last night. Denies dysuria or hematuria. Denies weak urinary stream. Denies diarrhea. Denies fatigue.  Patient does not take Flomax currently.  The patient is without complaint. Set-up films were reviewed. The chart was checked.  Physical Findings  weight is 203 lb 1.6 oz (92.126 kg). His blood pressure is 148/87 and his pulse is 88. His respiration is 16. . Weight essentially stable.  No significant changes.  Impression The patient is tolerating radiation.  Plan Continue treatment as planned. I have suggested that Benjamin Fry try decreasing his liquid intake after 4 pm to avoid excessive nocturia.   This document serves as a record of services personally performed by Tyler Pita, MD. It was created on his behalf by Arlyce Harman, a trained medical scribe. The creation of this record is based on the scribe's personal observations and the provider's statements to them. This document has been checked and approved by the attending provider.       Sheral Apley Tammi Klippel, M.D.

## 2015-02-08 ENCOUNTER — Ambulatory Visit
Admission: RE | Admit: 2015-02-08 | Discharge: 2015-02-08 | Disposition: A | Discharge status: Home / Self Care | Payer: Medicare Other | Source: Ambulatory Visit | Attending: Radiation Oncology | Admitting: Radiation Oncology

## 2015-02-08 DIAGNOSIS — Z510 Encounter for antineoplastic radiation therapy: Secondary | ICD-10-CM | POA: Diagnosis not present

## 2015-02-09 ENCOUNTER — Ambulatory Visit
Admission: RE | Admit: 2015-02-09 | Discharge: 2015-02-09 | Disposition: A | Discharge status: Home / Self Care | Payer: Medicare Other | Source: Ambulatory Visit | Attending: Radiation Oncology | Admitting: Radiation Oncology

## 2015-02-09 DIAGNOSIS — Z510 Encounter for antineoplastic radiation therapy: Secondary | ICD-10-CM | POA: Diagnosis not present

## 2015-02-10 ENCOUNTER — Encounter: Payer: Self-pay | Admitting: Radiation Oncology

## 2015-02-10 ENCOUNTER — Ambulatory Visit
Admission: RE | Admit: 2015-02-10 | Discharge: 2015-02-10 | Disposition: A | Discharge status: Home / Self Care | Payer: Medicare Other | Source: Ambulatory Visit | Attending: Radiation Oncology | Admitting: Radiation Oncology

## 2015-02-10 VITALS — BP 132/73 | HR 86 | Resp 16 | Wt 202.0 lb

## 2015-02-10 DIAGNOSIS — C61 Malignant neoplasm of prostate: Secondary | ICD-10-CM

## 2015-02-10 DIAGNOSIS — Z510 Encounter for antineoplastic radiation therapy: Secondary | ICD-10-CM | POA: Diagnosis not present

## 2015-02-10 NOTE — Progress Notes (Signed)
  Radiation Oncology         [phone]   Name: Benjamin Fry MRN: [id number]   Date: 02/10/2015  DOB: 07/07/1940     Weekly Radiation Therapy Management    ICD-9-CM ICD-10-CM   1. Malignant neoplasm of prostate 185 C61     Current Dose: 23.4 Gy  Planned Dose:  45 Gy plus Seed Implant  Narrative The patient presents for routine under treatment assessment. Weight and vitals stable. Denies pain. Reports he voided every hour last night. Denies dysuria or hematuria. Reports urinary urgency. Reports softer stool. Denies fatigue. The patient is without complaint. Set-up films were reviewed. The chart was checked.  Physical Findings  weight is 202 lb (91.627 kg). His blood pressure is 132/73 and his pulse is 86. His respiration is 16. . Weight essentially stable.  No significant changes.  Impression The patient is tolerating radiation.  Plan Continue treatment as planned. I have discussed with Benjamin Fry about the benefits of having a full bladder during treatment and suggested ways to ensure a full bladder at treatment time.   This document serves as a record of services personally performed by Tyler Pita, MD. It was created on his behalf by Arlyce Harman, a trained medical scribe. The creation of this record is based on the scribe's personal observations and the provider's statements to them. This document has been checked and approved by the attending provider.       Sheral Apley Tammi Klippel, M.D.

## 2015-02-10 NOTE — Progress Notes (Addendum)
Weight and vitals stable. Denies pain. Reports he voided every hour last night. Denies dysuria or hematuria. Reports urinary urgency. Reports softer stool. Denies fatigue.  BP 132/73 mmHg  Pulse 86  Resp 16  Wt 202 lb (91.627 kg) Wt Readings from Last 3 Encounters:  02/10/15 202 lb (91.627 kg)  02/04/15 203 lb 1.6 oz (92.126 kg)  01/28/15 206 lb 6.4 oz (93.622 kg)

## 2015-02-11 ENCOUNTER — Ambulatory Visit
Admission: RE | Admit: 2015-02-11 | Discharge: 2015-02-11 | Disposition: A | Discharge status: Home / Self Care | Payer: Medicare Other | Source: Ambulatory Visit | Attending: Radiation Oncology | Admitting: Radiation Oncology

## 2015-02-11 DIAGNOSIS — Z510 Encounter for antineoplastic radiation therapy: Secondary | ICD-10-CM | POA: Diagnosis not present

## 2015-02-14 ENCOUNTER — Ambulatory Visit
Admission: RE | Admit: 2015-02-14 | Discharge: 2015-02-14 | Disposition: A | Discharge status: Home / Self Care | Payer: Medicare Other | Source: Ambulatory Visit | Attending: Radiation Oncology | Admitting: Radiation Oncology

## 2015-02-14 DIAGNOSIS — Z510 Encounter for antineoplastic radiation therapy: Secondary | ICD-10-CM | POA: Diagnosis not present

## 2015-02-15 ENCOUNTER — Ambulatory Visit: Payer: Medicare Other

## 2015-02-15 ENCOUNTER — Ambulatory Visit
Admission: RE | Admit: 2015-02-15 | Discharge: 2015-02-15 | Disposition: A | Discharge status: Home / Self Care | Payer: Medicare Other | Source: Ambulatory Visit | Attending: Radiation Oncology | Admitting: Radiation Oncology

## 2015-02-15 DIAGNOSIS — Z510 Encounter for antineoplastic radiation therapy: Secondary | ICD-10-CM | POA: Diagnosis not present

## 2015-02-16 ENCOUNTER — Ambulatory Visit: Payer: Medicare Other

## 2015-02-17 ENCOUNTER — Ambulatory Visit: Payer: Medicare Other

## 2015-02-17 ENCOUNTER — Ambulatory Visit
Admission: RE | Admit: 2015-02-17 | Discharge: 2015-02-17 | Disposition: A | Discharge status: Home / Self Care | Payer: Medicare Other | Source: Ambulatory Visit | Attending: Radiation Oncology | Admitting: Radiation Oncology

## 2015-02-17 DIAGNOSIS — Z510 Encounter for antineoplastic radiation therapy: Secondary | ICD-10-CM | POA: Diagnosis not present

## 2015-02-18 ENCOUNTER — Ambulatory Visit
Admission: RE | Admit: 2015-02-18 | Discharge: 2015-02-18 | Disposition: A | Discharge status: Home / Self Care | Payer: Medicare Other | Source: Ambulatory Visit | Attending: Radiation Oncology | Admitting: Radiation Oncology

## 2015-02-18 ENCOUNTER — Encounter: Payer: Self-pay | Admitting: Radiation Oncology

## 2015-02-18 VITALS — BP 129/80 | HR 79 | Temp 98.0°F | Ht 72.0 in | Wt 204.7 lb

## 2015-02-18 DIAGNOSIS — C61 Malignant neoplasm of prostate: Secondary | ICD-10-CM

## 2015-02-18 DIAGNOSIS — Z510 Encounter for antineoplastic radiation therapy: Secondary | ICD-10-CM | POA: Diagnosis not present

## 2015-02-18 NOTE — Progress Notes (Addendum)
Benjamin Fry has received 18 fractions to his pelvis for prostate cancer.  He has mild discomfort when voiding.

## 2015-02-18 NOTE — Progress Notes (Signed)
  Radiation Oncology         [phone]   Name: Benjamin Fry MRN: [id number]   Date: 02/18/2015  DOB: 09-Jun-1940     Weekly Radiation Therapy Management  Malignant neoplasm of prostate  Current Dose: Reviewed Planned Dose:  78 Gy  Narrative The patient presents for routine under treatment assessment. He has completed 18/25 fractions. He reports symptoms of slight dysuria, without hematuria,  and without nocturia. After drinking fluids he has regular bowel movements. He reports that his appetite is good and that he drinks plenty of water. He had specific questions about what "the machine" does during his treatment and why doesn't he "feel heat". This questions was answered in detail. Set-up films were reviewed. The chart was checked. 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 He reports that he moved to [place] in August of 2015 from [place]. He projected a healthy mental status and was not accompanied by his family for today's radiation oncology visit.  Physical Findings  weight is 232 lb (105.235 kg). His oral temperature is 97.8 F (36.6 C). His blood pressure is 162/78 and his pulse is 72. His respiration is 20.  Weight essentially stable. There is no significant changes to the status of the paients overall health to be noted at this time. The patient is alert and oriented x3.  Impression Benjamin Fry is a 74 year old gentleman presenting to clinic in regards to his malignant neoplasm of the prostate. The patient is tolerating radiation. He understands the benefits, purpose, and importance of remaining hydrated for the best possible recovery and treatment. The patient understands that he can access his appointments and medical records via [facility].   Plan The patient has been advised to continue treatment as planned. The patient is aware of his follow-up appointment with radiation oncology to take place next week, as schduled. All vocalized questions and concerns have been addressed.  If the patient develops any further questions or concerns in regards to his treatment and recovery, he has been encouraged to contact Dr. Tammi Klippel, MD.      This document serves as a record of services personally performed by Tyler Pita, MD. It was created on his behalf by Lenn Cal, a trained medical scribe. The creation of this record is based on the scribe's personal observations and the provider's statements to them. This document has been checked and approved by the attending provider. ____________________________________________________________________   Sheral Apley. Tammi Klippel, M.D.

## 2015-02-21 ENCOUNTER — Ambulatory Visit
Admission: RE | Admit: 2015-02-21 | Discharge: 2015-02-21 | Disposition: A | Discharge status: Home / Self Care | Payer: Medicare Other | Source: Ambulatory Visit | Attending: Radiation Oncology | Admitting: Radiation Oncology

## 2015-02-21 DIAGNOSIS — Z510 Encounter for antineoplastic radiation therapy: Secondary | ICD-10-CM | POA: Diagnosis not present

## 2015-02-22 ENCOUNTER — Ambulatory Visit
Admission: RE | Admit: 2015-02-22 | Discharge: 2015-02-22 | Disposition: A | Discharge status: Home / Self Care | Payer: Medicare Other | Source: Ambulatory Visit | Attending: Radiation Oncology | Admitting: Radiation Oncology

## 2015-02-22 DIAGNOSIS — Z510 Encounter for antineoplastic radiation therapy: Secondary | ICD-10-CM | POA: Diagnosis not present

## 2015-02-23 ENCOUNTER — Ambulatory Visit
Admission: RE | Admit: 2015-02-23 | Discharge: 2015-02-23 | Disposition: A | Discharge status: Home / Self Care | Payer: Medicare Other | Source: Ambulatory Visit | Attending: Radiation Oncology | Admitting: Radiation Oncology

## 2015-02-23 DIAGNOSIS — Z510 Encounter for antineoplastic radiation therapy: Secondary | ICD-10-CM | POA: Diagnosis not present

## 2015-02-24 ENCOUNTER — Ambulatory Visit
Admission: RE | Admit: 2015-02-24 | Discharge: 2015-02-24 | Disposition: A | Discharge status: Home / Self Care | Payer: Medicare Other | Source: Ambulatory Visit | Attending: Radiation Oncology | Admitting: Radiation Oncology

## 2015-02-24 ENCOUNTER — Ambulatory Visit: Payer: Medicare Other

## 2015-02-24 DIAGNOSIS — Z510 Encounter for antineoplastic radiation therapy: Secondary | ICD-10-CM | POA: Diagnosis not present

## 2015-02-25 ENCOUNTER — Ambulatory Visit: Payer: Medicare Other

## 2015-02-28 ENCOUNTER — Ambulatory Visit
Admission: RE | Admit: 2015-02-28 | Discharge: 2015-02-28 | Disposition: A | Discharge status: Home / Self Care | Payer: Medicare Other | Source: Ambulatory Visit | Attending: Radiation Oncology | Admitting: Radiation Oncology

## 2015-02-28 ENCOUNTER — Other Ambulatory Visit: Payer: Self-pay | Admitting: Urology

## 2015-02-28 ENCOUNTER — Telehealth: Payer: Self-pay | Admitting: *Deleted

## 2015-02-28 ENCOUNTER — Ambulatory Visit: Payer: Medicare Other

## 2015-02-28 ENCOUNTER — Encounter: Payer: Self-pay | Admitting: Radiation Oncology

## 2015-02-28 VITALS — BP 129/81 | HR 77 | Temp 97.7°F | Wt 203.1 lb

## 2015-02-28 DIAGNOSIS — Z8249 Family history of ischemic heart disease and other diseases of the circulatory system: Secondary | ICD-10-CM | POA: Diagnosis not present

## 2015-02-28 DIAGNOSIS — C61 Malignant neoplasm of prostate: Secondary | ICD-10-CM | POA: Insufficient documentation

## 2015-02-28 DIAGNOSIS — Z806 Family history of leukemia: Secondary | ICD-10-CM | POA: Diagnosis not present

## 2015-02-28 DIAGNOSIS — Z823 Family history of stroke: Secondary | ICD-10-CM | POA: Insufficient documentation

## 2015-02-28 DIAGNOSIS — Z510 Encounter for antineoplastic radiation therapy: Secondary | ICD-10-CM | POA: Diagnosis present

## 2015-02-28 NOTE — Progress Notes (Signed)
Weekly assessment of radiation to pelvis.Completed 25 of 25 treatments to pelvis for prostate cancer.Nocturia several times a night.Frequency and urgency of urination without burning.Bowels soft and regular. BP 129/81 mmHg  Pulse 77  Temp(Src) 97.7 F (36.5 C)  Wt 203 lb 1.6 oz (92.126 kg)  Wt Readings from Last 3 Encounters:  02/28/15 203 lb 1.6 oz (92.126 kg)  02/18/15 204 lb 11.2 oz (92.851 kg)  02/10/15 202 lb (91.627 kg)    Scheduled for seed implant.

## 2015-02-28 NOTE — Telephone Encounter (Signed)
Called patient to inform of implant date, lvm for a  Return call.

## 2015-02-28 NOTE — Progress Notes (Signed)
   Department of Radiation Oncology  Phone:  [phone] Fax:        [phone]  Weekly Treatment Note    Name: Benjamin Fry Date: 02/28/2015 MRN: [id number] DOB: 1940-12-22  Current dose: 45 Gy  Current fraction: 25   MEDICATIONS: Current Outpatient Prescriptions  Medication Sig Dispense Refill  . naproxen sodium (ANAPROX) 220 MG tablet Take 220 mg by mouth 2 (two) times daily with a meal.     No current facility-administered medications for this encounter.    ALLERGIES: Review of patient's allergies indicates no known allergies.   LABORATORY DATA:  No results found for: WBC, HGB, HCT, MCV, PLT No results found for: NA, K, CL, CO2 No results found for: ALT, AST, GGT, ALKPHOS, BILITOT   NARRATIVE: Benjamin Fry was seen today for weekly treatment management. The chart was checked and the patient's films were reviewed.  Weekly assessment of radiation to pelvis.Completed 25 of 25 treatments to pelvis for prostate cancer. Nocturia several times a night. Frequency and urgency of urination without burning.Bowels soft and regular.   PHYSICAL EXAMINATION: weight is 203 lb 1.6 oz (92.126 kg). His temperature is 97.7 F (36.5 C). His blood pressure is 129/81 and his pulse is 77.        ASSESSMENT: The patient did satisfactorily with treatment.   PLAN: The patient has been scheduled for a prostate seed boost in the near future.   ------------------------------------------------  Jodelle Gross, MD, PhD  This document serves as a record of services personally performed by Kyung Rudd, MD. It was created on his behalf by Derek Mound, a trained medical scribe. The creation of this record is based on the scribe's personal observations and the Anneke Cundy's statements to them. This document has been checked and approved by the attending Lillymae Duet.

## 2015-03-01 ENCOUNTER — Ambulatory Visit: Payer: Medicare Other

## 2015-03-01 ENCOUNTER — Encounter (HOSPITAL_BASED_OUTPATIENT_CLINIC_OR_DEPARTMENT_OTHER)
Admission: RE | Admit: 2015-03-01 | Discharge: 2015-03-01 | Disposition: A | Discharge status: Home / Self Care | Payer: Medicare Other | Source: Ambulatory Visit | Attending: Urology | Admitting: Urology

## 2015-03-01 ENCOUNTER — Ambulatory Visit (HOSPITAL_BASED_OUTPATIENT_CLINIC_OR_DEPARTMENT_OTHER)
Admission: RE | Admit: 2015-03-01 | Discharge: 2015-03-01 | Disposition: A | Discharge status: Home / Self Care | Payer: Medicare Other | Source: Ambulatory Visit | Attending: Urology | Admitting: Urology

## 2015-03-01 DIAGNOSIS — C61 Malignant neoplasm of prostate: Secondary | ICD-10-CM | POA: Insufficient documentation

## 2015-03-02 ENCOUNTER — Ambulatory Visit: Payer: Medicare Other

## 2015-03-03 ENCOUNTER — Ambulatory Visit: Payer: Medicare Other

## 2015-03-04 ENCOUNTER — Ambulatory Visit: Payer: Medicare Other

## 2015-03-05 DIAGNOSIS — C61 Malignant neoplasm of prostate: Secondary | ICD-10-CM | POA: Diagnosis present

## 2015-03-07 ENCOUNTER — Ambulatory Visit: Payer: Medicare Other

## 2015-03-07 NOTE — Progress Notes (Signed)
  Radiation Oncology         [phone]) [phone] ________________________________  Name: Benjamin Fry MRN: [id number]  Date: 02/28/2015  DOB: February 04, 1941  End of Treatment Note   ICD-9-CM ICD-10-CM   1. Malignant neoplasm of prostate 185 C61     DIAGNOSIS: 74 y.o. gentleman with stage T1c adenocarcinoma of the prostate with a Gleason's score of 4+3 and a PSA of 19.35.     Indication for treatment:  Curative, Definitive Radiotherapy with seed boost  Radiation treatment dates:   01/25/2015-02/28/2015  Site/dose:   The prostate was treated to 45 Gy in 25 fractions of 1.8 Gy  Beams/energy:   The patient was treated with standard 4-field 3D conformal delivering 15 MV X-rays to AP, PA, Right Lat, and Left Lat fields.  Image guidance was performed with daily cone beam CT prior to each fraction to align to gold markers in the prostate and assure proper bladder and rectal fill volumes.  Immobilization was achieved with [employer] custom mold.  Narrative: The patient tolerated radiation treatment relatively well.   The patient experienced some minor urinary irritation and modest fatigue.  He noted nocturia several times a night.  He had frequency and urgency of urination without burning.  He noted bowels were soft and regular.  Plan: The patient has completed radiation treatment. He will return for seed implant with Dr. Karsten Ro on 10/28. I advised him to call or return sooner if he has any questions or concerns related to his recovery or treatment. ________________________________  Sheral Apley. Tammi Klippel, M.D.

## 2015-03-08 ENCOUNTER — Ambulatory Visit: Payer: Medicare Other

## 2015-03-09 ENCOUNTER — Encounter: Payer: Self-pay | Admitting: Radiation Oncology

## 2015-03-09 ENCOUNTER — Ambulatory Visit: Payer: Medicare Other

## 2015-03-14 ENCOUNTER — Telehealth: Payer: Self-pay | Admitting: Radiation Oncology

## 2015-03-14 NOTE — Telephone Encounter (Signed)
Patient left message wanting to know if he may get a flu shot. Per Dr. Valere Dross explained that so long as he is feeling well, without fever, congestion, cough or weakness he may get a flu shot. Patient states, "I feel great." He expressed appreciation for the call.

## 2015-03-24 ENCOUNTER — Telehealth: Payer: Self-pay | Admitting: *Deleted

## 2015-03-24 NOTE — Telephone Encounter (Signed)
Called patient to remind of blood work for procedure on 04-01-15, lvm for a return call

## 2015-03-25 DIAGNOSIS — C61 Malignant neoplasm of prostate: Secondary | ICD-10-CM | POA: Diagnosis not present

## 2015-03-25 LAB — CBC
HCT: 40.9 % (ref 39.0–52.0)
Hemoglobin: 14.5 g/dL (ref 13.0–17.0)
MCH: 32.5 pg (ref 26.0–34.0)
MCHC: 35.5 g/dL (ref 30.0–36.0)
MCV: 91.7 fL (ref 78.0–100.0)
Platelets: 206 10*3/uL (ref 150–400)
RBC: 4.46 MIL/uL (ref 4.22–5.81)
RDW: 14 % (ref 11.5–15.5)
WBC: 6.6 10*3/uL (ref 4.0–10.5)

## 2015-03-25 LAB — COMPREHENSIVE METABOLIC PANEL
ALT: 29 U/L (ref 17–63)
AST: 28 U/L (ref 15–41)
Albumin: 4.1 g/dL (ref 3.5–5.0)
Alkaline Phosphatase: 46 U/L (ref 38–126)
Anion gap: 6 (ref 5–15)
BUN: 16 mg/dL (ref 6–20)
CO2: 26 mmol/L (ref 22–32)
Calcium: 9.4 mg/dL (ref 8.9–10.3)
Chloride: 108 mmol/L (ref 101–111)
Creatinine, Ser: 1.2 mg/dL (ref 0.61–1.24)
GFR calc Af Amer: >60 mL/min (ref >60)
GFR calc non Af Amer: 57 mL/min — ABNORMAL LOW (ref >60)
Glucose, Bld: 111 mg/dL — ABNORMAL HIGH (ref 65–99)
Potassium: 4.2 mmol/L (ref 3.5–5.1)
Sodium: 140 mmol/L (ref 135–145)
Total Bilirubin: 0.9 mg/dL (ref 0.3–1.2)
Total Protein: 6.7 g/dL (ref 6.5–8.1)

## 2015-03-25 LAB — APTT: aPTT: 29 seconds (ref 24–37)

## 2015-03-25 LAB — PROTIME-INR
INR: 0.95 (ref 0.00–1.49)
Prothrombin Time: 12.9 seconds (ref 11.6–15.2)

## 2015-03-28 ENCOUNTER — Encounter (HOSPITAL_BASED_OUTPATIENT_CLINIC_OR_DEPARTMENT_OTHER): Payer: Self-pay | Admitting: *Deleted

## 2015-03-28 NOTE — Progress Notes (Signed)
NPO AFTER MN.  ARRIVE AT 0930. CURRENTS LABS RESULTS, CXR, AND EKG IN CHART AND EPIC.   WILL DO FLEET ENEMA AM DOS.

## 2015-03-31 ENCOUNTER — Telehealth: Payer: Self-pay | Admitting: *Deleted

## 2015-03-31 NOTE — Telephone Encounter (Signed)
CALLED PATIENT TO REMIND OF PROCEDURE FOR 04-01-15, LVM FOR A RETURN CALL

## 2015-04-01 ENCOUNTER — Ambulatory Visit (HOSPITAL_BASED_OUTPATIENT_CLINIC_OR_DEPARTMENT_OTHER): Payer: Medicare Other | Admitting: Anesthesiology

## 2015-04-01 ENCOUNTER — Ambulatory Visit (HOSPITAL_COMMUNITY): Payer: Medicare Other

## 2015-04-01 ENCOUNTER — Encounter (HOSPITAL_BASED_OUTPATIENT_CLINIC_OR_DEPARTMENT_OTHER): Payer: Self-pay | Admitting: *Deleted

## 2015-04-01 ENCOUNTER — Ambulatory Visit (HOSPITAL_BASED_OUTPATIENT_CLINIC_OR_DEPARTMENT_OTHER)
Admission: RE | Admit: 2015-04-01 | Discharge: 2015-04-01 | Disposition: A | Discharge status: Home / Self Care | Payer: Medicare Other | Source: Ambulatory Visit | Attending: Urology | Admitting: Urology

## 2015-04-01 ENCOUNTER — Encounter (HOSPITAL_BASED_OUTPATIENT_CLINIC_OR_DEPARTMENT_OTHER)
Admission: RE | Disposition: A | Discharge status: Home / Self Care | Payer: Self-pay | Source: Ambulatory Visit | Attending: Urology

## 2015-04-01 DIAGNOSIS — C61 Malignant neoplasm of prostate: Secondary | ICD-10-CM

## 2015-04-01 HISTORY — DX: Presence of spectacles and contact lenses: Z97.3

## 2015-04-01 HISTORY — PX: CYSTOSCOPY: SHX5120

## 2015-04-01 HISTORY — PX: RADIOACTIVE SEED IMPLANT: SHX5150

## 2015-04-01 HISTORY — DX: Unspecified symptoms and signs involving the genitourinary system: R39.9

## 2015-04-01 SURGERY — INSERTION, RADIATION SOURCE, PROSTATE
Anesthesia: General | Site: Prostate

## 2015-04-01 MED ORDER — LACTATED RINGERS IV SOLN
INTRAVENOUS | Status: DC | PRN
  Administered 2015-04-01 (×2): via INTRAVENOUS

## 2015-04-01 MED ORDER — LACTATED RINGERS IV SOLN
INTRAVENOUS | Status: DC
  Administered 2015-04-01: 11:00:00 via INTRAVENOUS
  Filled 2015-04-01: qty 1000

## 2015-04-01 MED ORDER — CIPROFLOXACIN IN D5W 400 MG/200ML IV SOLN
INTRAVENOUS | Status: AC
  Filled 2015-04-01: qty 200

## 2015-04-01 MED ORDER — FENTANYL CITRATE (PF) 100 MCG/2ML IJ SOLN
25.0000 ug | INTRAMUSCULAR | Status: DC | PRN
Start: 2015-04-01 — End: 2015-04-01
  Filled 2015-04-01: qty 1

## 2015-04-01 MED ORDER — STERILE WATER FOR IRRIGATION IR SOLN
Status: DC | PRN
  Administered 2015-04-01: 3000 mL

## 2015-04-01 MED ORDER — FENTANYL CITRATE (PF) 100 MCG/2ML IJ SOLN
INTRAMUSCULAR | Status: DC | PRN
  Administered 2015-04-01 (×2): 25 ug via INTRAVENOUS
  Administered 2015-04-01: 50 ug via INTRAVENOUS

## 2015-04-01 MED ORDER — KETOROLAC TROMETHAMINE 30 MG/ML IJ SOLN
INTRAMUSCULAR | Status: DC | PRN
  Administered 2015-04-01: 15 mg via INTRAVENOUS

## 2015-04-01 MED ORDER — OXYCODONE HCL 5 MG PO TABS
5.0000 mg | ORAL_TABLET | Freq: Once | ORAL | Status: DC | PRN
  Filled 2015-04-01: qty 1

## 2015-04-01 MED ORDER — PROPOFOL 10 MG/ML IV BOLUS
INTRAVENOUS | Status: DC | PRN
  Administered 2015-04-01: 200 mg via INTRAVENOUS
  Administered 2015-04-01: 20 mg via INTRAVENOUS

## 2015-04-01 MED ORDER — TAMSULOSIN HCL 0.4 MG PO CAPS
0.4000 mg | ORAL_CAPSULE | Freq: Once | ORAL | Status: DC
  Filled 2015-04-01: qty 1

## 2015-04-01 MED ORDER — LIDOCAINE HCL (CARDIAC) 20 MG/ML IV SOLN
INTRAVENOUS | Status: DC | PRN
  Administered 2015-04-01: 100 mg via INTRAVENOUS

## 2015-04-01 MED ORDER — CIPROFLOXACIN IN D5W 400 MG/200ML IV SOLN
400.0000 mg | INTRAVENOUS | Status: AC
  Administered 2015-04-01: 400 mg via INTRAVENOUS
  Filled 2015-04-01: qty 200

## 2015-04-01 MED ORDER — FLEET ENEMA 7-19 GM/118ML RE ENEM
1.0000 | ENEMA | Freq: Once | RECTAL | Status: DC
  Filled 2015-04-01: qty 1

## 2015-04-01 MED ORDER — ACETAMINOPHEN 160 MG/5ML PO SOLN
325.0000 mg | ORAL | Status: DC | PRN
  Filled 2015-04-01: qty 20.3

## 2015-04-01 MED ORDER — ACETAMINOPHEN 325 MG PO TABS
ORAL_TABLET | ORAL | Status: DC | PRN
  Administered 2015-04-01: 1000 mg via ORAL

## 2015-04-01 MED ORDER — DEXAMETHASONE SODIUM PHOSPHATE 4 MG/ML IJ SOLN
INTRAMUSCULAR | Status: DC | PRN
  Administered 2015-04-01: 5 mg via INTRAVENOUS

## 2015-04-01 MED ORDER — HYDROCODONE-ACETAMINOPHEN 10-325 MG PO TABS: 1.0000 | ORAL_TABLET | ORAL | Status: AC | PRN

## 2015-04-01 MED ORDER — ACETAMINOPHEN 325 MG PO TABS
325.0000 mg | ORAL_TABLET | ORAL | Status: DC | PRN
  Filled 2015-04-01: qty 2

## 2015-04-01 MED ORDER — IOHEXOL 350 MG/ML SOLN
INTRAVENOUS | Status: DC | PRN
  Administered 2015-04-01: 7 mL

## 2015-04-01 MED ORDER — OXYCODONE HCL 5 MG/5ML PO SOLN
5.0000 mg | Freq: Once | ORAL | Status: DC | PRN
  Filled 2015-04-01: qty 5

## 2015-04-01 MED ORDER — FENTANYL CITRATE (PF) 100 MCG/2ML IJ SOLN
INTRAMUSCULAR | Status: AC
  Filled 2015-04-01: qty 4

## 2015-04-01 MED ORDER — ACETAMINOPHEN 500 MG PO TABS
ORAL_TABLET | ORAL | Status: AC
  Filled 2015-04-01: qty 2

## 2015-04-01 MED ORDER — ONDANSETRON HCL 4 MG/2ML IJ SOLN
INTRAMUSCULAR | Status: DC | PRN
  Administered 2015-04-01: 4 mg via INTRAVENOUS

## 2015-04-01 SURGICAL SUPPLY — 33 items
BAG URINE DRAINAGE (UROLOGICAL SUPPLIES) ×3 IMPLANT
BLADE CLIPPER SURG (BLADE) ×3 IMPLANT
CATH FOLEY 2WAY SLVR  5CC 16FR (CATHETERS) ×2
CATH FOLEY 2WAY SLVR 5CC 16FR (CATHETERS) ×4 IMPLANT
CATH ROBINSON RED A/P 16FR (CATHETERS) ×3 IMPLANT
CATH ROBINSON RED A/P 20FR (CATHETERS) ×3 IMPLANT
CLOTH BEACON ORANGE TIMEOUT ST (SAFETY) ×3 IMPLANT
COVER BACK TABLE 60X90IN (DRAPES) ×3 IMPLANT
COVER MAYO STAND STRL (DRAPES) ×3 IMPLANT
DRSG TEGADERM 4X4.75 (GAUZE/BANDAGES/DRESSINGS) ×3 IMPLANT
DRSG TEGADERM 8X12 (GAUZE/BANDAGES/DRESSINGS) ×3 IMPLANT
GLOVE BIO SURGEON STRL SZ7.5 (GLOVE) IMPLANT
GLOVE BIO SURGEON STRL SZ8 (GLOVE) ×3 IMPLANT
GLOVE ECLIPSE 7.0 STRL STRAW (GLOVE) ×3 IMPLANT
GLOVE ECLIPSE 8.0 STRL XLNG CF (GLOVE) ×15 IMPLANT
GLOVE SURG SS PI 7.5 STRL IVOR (GLOVE) ×6 IMPLANT
GOWN STRL REUS W/ TWL XL LVL3 (GOWN DISPOSABLE) ×2 IMPLANT
GOWN STRL REUS W/TWL LRG LVL3 (GOWN DISPOSABLE) ×3 IMPLANT
GOWN STRL REUS W/TWL XL LVL3 (GOWN DISPOSABLE) ×4 IMPLANT
GOWN XL W/COTTON TOWEL STD (GOWNS) IMPLANT
HOLDER FOLEY CATH W/STRAP (MISCELLANEOUS) IMPLANT
IV NS IRRIG 3000ML ARTHROMATIC (IV SOLUTION) IMPLANT
IV WATER IRR. 1000ML (IV SOLUTION) IMPLANT
KIT ROOM TURNOVER WOR (KITS) ×3 IMPLANT
MANIFOLD NEPTUNE II (INSTRUMENTS) IMPLANT
Nucletron selectSeed 1-125 ×171 IMPLANT
PACK CYSTO (CUSTOM PROCEDURE TRAY) ×3 IMPLANT
SPONGE GAUZE 4X4 12PLY STER LF (GAUZE/BANDAGES/DRESSINGS) ×3 IMPLANT
SYRINGE 10CC LL (SYRINGE) ×3 IMPLANT
TUBE CONNECTING 12X1/4 (SUCTIONS) IMPLANT
UNDERPAD 30X30 INCONTINENT (UNDERPADS AND DIAPERS) ×6 IMPLANT
WATER STERILE IRR 3000ML UROMA (IV SOLUTION) ×3 IMPLANT
WATER STERILE IRR 500ML POUR (IV SOLUTION) ×3 IMPLANT

## 2015-04-01 NOTE — H&P (Signed)
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   Pathology: Gleason 3+3 = 6 in 5% of one core and Gleason 3+4 = 7 in 40% of a second core.  Metastatic workup: CT scan and bone scan - negative  He elected to undergo active surveillance after having had all of the treatment options discussed with him.  Repeat TRUS/Bx 09/23/14: PSA 19.35, DRE - nl., Vol. - 47 cc  Path: Adenocarcinoma Gleason 4+3=7 in 2 cores (Lt. base med. 90% & Lt. base lat. 20%) and 1 core 3+3=6 Lt. mid. lat. <5%)  Stage: T1c     Interval Hx: No new complaints are noted today.   Past Medical History Problems  1. History of Elevated PSA (R97.2) 2. History of malignant neoplasm of prostate (Z85.46)  Surgical History Problems  1. History of Biopsy Of The Prostate Needle 2. History of Laparoscopy Repair Of Initial Inguinal Hernia  Allergies Medication  1. No Known Drug Allergies  Family History Problems  1. Family history of acute myocardial infarction (Z82.49) : Father 2. Family history of cerebrovascular accident (CVA) (Z82.3) : Mother 3. Family history of leukemia (Z80.6) : Father  Social History Problems  1. Alcohol use (Z78.9)   2 per month 2. Caffeine use (F15.90)   1 3. Father deceased 49. Mother deceased 10. Never a smoker 6. Number of children   2 sons 72. 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  Genitourinary: urinary frequency, feelings of urinary urgency, nocturia and incontinence.    Vitals Vital Signs  Weight: 205 lb  BMI Calculated: 27.8 BSA  Calculated: 2.15 Blood Pressure: 140 / 72 Temperature: 97 F Heart Rate: 78  Physical Exam Constitutional: Well nourished and well developed . No acute distress.  ENT:. The ears and nose are normal in appearance.  Neck: The appearance of the neck is normal and no neck mass is present.  Pulmonary: No respiratory distress and normal respiratory rhythm and effort.  Cardiovascular: Heart rate and rhythm are normal . No peripheral edema.  Abdomen: The abdomen is soft and nontender. No masses are palpated. No CVA tenderness. No hernias are palpable. No hepatosplenomegaly noted.  Rectal: Rectal exam demonstrates normal sphincter tone, no tenderness and no masses. Estimated prostate size is 3+. The prostate has no nodularity and is not tender. The left seminal vesicle is nonpalpable. The right seminal vesicle is nonpalpable. The perineum is normal on inspection.  Genitourinary: Examination of the penis demonstrates no discharge, no masses, no lesions and a normal meatus. The scrotum is without lesions. The right epididymis is palpably normal and non-tender. The left epididymis is palpably normal and non-tender. The right testis is non-tender and without masses. The left testis is non-tender and without masses.  Lymphatics: The femoral and inguinal nodes are not enlarged or tender.  Skin: Normal skin turgor, no visible rash and no visible skin lesions.  Neuro/Psych:. Mood and affect are appropriate.       Test Name Result Flag Reference CT-ABD/PELVIS W/O CONTRAST (Report)   ** RADIOLOGY REPORT BY [facility] **   CLINICAL DATA: Prostate cancer with positive biopsy 09/23/2014. Elevated serum PSA level. Initial encounter.  EXAM: CT  ABDOMEN AND PELVIS WITHOUT CONTRAST  TECHNIQUE: Multidetector CT imaging of the abdomen and pelvis was performed following the standard protocol without IV contrast.  COMPARISON: None.  FINDINGS: Lower chest: Clear lung bases. No pleural or  pericardial effusion. Central coronary artery calcifications partially imaged.  Hepatobiliary: As evaluated in the noncontrast state, the liver demonstrates no focal abnormality. No evidence of gallstones, gallbladder wall thickening or biliary dilatation.  Pancreas: Unremarkable. No pancreatic ductal dilatation or surrounding inflammatory changes.  Spleen: Normal in size without focal abnormality.  Adrenals/Urinary Tract: Both adrenal glands appear normal.There is mild symmetric perinephric soft tissue stranding bilaterally which is nonspecific. No evidence of urinary tract calculus or hydronephrosis. No renal or bladder abnormalities identified on noncontrast imaging.  Stomach/Bowel: No evidence of bowel wall thickening, distention or surrounding inflammatory change.The appendix appears normal. There is minimal distal colonic diverticulosis.  Vascular/Lymphatic: Small retroperitoneal lymph nodes are not pathologically enlarged. No pelvic adenopathy demonstrated. Minimal aortoiliac atherosclerosis.  Reproductive: Mild enlargement of the prostate gland with central dystrophic calcifications. No focal mass lesion identified.  Other: There is mildly prominent fat within both inguinal canals. There is a tiny umbilical hernia containing only fat.  Musculoskeletal: No highly worrisome osseous lesions demonstrated. There is an irregular sclerotic lesion in the right superior acetabulum measuring 10 mm on image 75, likely a bone island. There is multilevel spondylosis with endplate degeneration throughout the thoracolumbar spine.  IMPRESSION: 1. No findings suspicious for metastatic prostate cancer. 2. Small retroperitoneal lymph nodes are not pathologically enlarged. 3. Small sclerotic lesion in the right superior acetabulum is not entirely specific, although likely an incidental bone island.   Electronically Signed  By: Richardean Sale M.D.  On: 10/12/2014 11:01    We  went over the results of his metastatic workup which has revealed no suspicious findings for metastatic disease on his CT scan and a bone scan with several areas of uptake that are most likely degenerative in nature.   Impression: Organ confined adenocarcinoma of the prostate.  Plan  The patient was counseled about the natural history of prostate cancer and the standard treatment options that are available for prostate cancer. It was explained to him how his age and life expectancy, clinical stage, Gleason score, and PSA affect his prognosis, the decision to proceed with additional staging studies, as well as how that information influences recommended treatment strategies. We discussed the roles for active surveillance, radiation therapy, surgical therapy, androgen deprivation, as well as ablative therapy options for the treatment of prostate cancer as appropriate to his individual cancer situation. We discussed the risks and benefits of these options with regard to their impact on cancer control and also in terms of potential adverse events, complications, and impact on quiality of life particularly related to urinary, bowel, and sexual function. The patient was encouraged to ask questions throughout the discussion today and all questions were answered to his stated satisfaction. In addition, the patient was provided with and/or directed to appropriate resources and literature for further education about prostate cancer and treatment options.   At 74 years of age I do not recommend surgery. I do feel he would be a good candidate for radiation therapy. We discussed this in detail and I also discussed the use of Lupron in order to improve his for cure of his prostate cancer. We discussed the side effects of the medication. He would like to proceed with external beam radiation and radioactive seed boost.  He received neoadjuvant Lupron injection  He is scheduled  for I-125 seed implantation.

## 2015-04-01 NOTE — Transfer of Care (Signed)
Immediate Anesthesia Transfer of Care Note  Patient: Benjamin Fry  Procedure(s) Performed: Procedure(s) with comments: RADIOACTIVE SEED IMPLANT/BRACHYTHERAPY IMPLANT (N/A) -    seeds implanted CYSTOSCOPY FLEXIBLE (N/A) - no seeds found in bladder  Patient Location: PACU  Anesthesia Type:General  Level of Consciousness: sedated and responds to stimulation  Airway & Oxygen Therapy: Patient Spontanous Breathing and Patient connected to nasal cannula oxygen  Post-op Assessment: Report given to RN  Post vital signs: Reviewed and stable  Last Vitals:  Filed Vitals:   04/01/15 0958  BP: 134/78  Pulse: 81  Temp: 36.5 C  Resp: 12    Complications: No apparent anesthesia complications

## 2015-04-01 NOTE — Anesthesia Procedure Notes (Signed)
Procedure Name: LMA Insertion Date/Time: 04/01/2015 11:32 AM Performed by: Bethena Roys T Pre-anesthesia Checklist: Patient identified, Emergency Drugs available, Suction available and Patient being monitored Patient Re-evaluated:Patient Re-evaluated prior to inductionOxygen Delivery Method: Circle System Utilized Preoxygenation: Pre-oxygenation with 100% oxygen Intubation Type: IV induction Ventilation: Mask ventilation without difficulty LMA: LMA inserted LMA Size: 5.0 Number of attempts: 1 Airway Equipment and Method: Bite block Placement Confirmation: positive ETCO2 Dental Injury: Teeth and Oropharynx as per pre-operative assessment

## 2015-04-01 NOTE — Anesthesia Postprocedure Evaluation (Signed)
  Anesthesia Post-op Note  Patient: Benjamin Fry  Procedure(s) Performed: Procedure(s) (LRB): RADIOACTIVE SEED IMPLANT/BRACHYTHERAPY IMPLANT (N/A) CYSTOSCOPY FLEXIBLE (N/A)  Patient Location: PACU  Anesthesia Type: General  Level of Consciousness: awake and alert   Airway and Oxygen Therapy: Patient Spontanous Breathing  Post-op Pain: mild  Post-op Assessment: Post-op Vital signs reviewed, Patient's Cardiovascular Status Stable, Respiratory Function Stable, Patent Airway and No signs of Nausea or vomiting  Last Vitals:  Filed Vitals:   04/01/15 1400  BP: 131/64  Pulse: 65  Temp:   Resp: 14    Post-op Vital Signs: stable   Complications: No apparent anesthesia complications

## 2015-04-01 NOTE — Op Note (Signed)
PATIENT:  Benjamin Fry  PRE-OPERATIVE DIAGNOSIS:  Adenocarcinoma of the prostate  POST-OPERATIVE DIAGNOSIS:  Same  PROCEDURE:  Procedure(s): 1. I-125 radioactive seed implantation 2. Cystoscopy  SURGEON:  Surgeon(s): Claybon Jabs  Radiation oncologist: Dr. Tyler Pita  ANESTHESIA:  General  EBL:  Minimal  DRAINS: 20 French Foley catheter  INDICATION: [name] is a 74 year old male with biopsy-proven adenocarcinoma the prostate who has elected proceed with radioactive seed implantation. He's undergone IM RT and presents today for I-125 radioactive seed boost implant.  Description of procedure: After informed consent the patient was brought to the major OR, placed on the table and administered general anesthesia. He was then moved to the modified lithotomy position with his perineum perpendicular to the floor. His perineum and genitalia were then sterilely prepped. An official timeout was then performed. A 16 French Foley catheter was then placed in the bladder and filled with dilute contrast, a rectal tube was placed in the rectum and the transrectal ultrasound probe was placed in the rectum and affixed to the stand. He was then sterilely draped.  Real time ultrasonography was used along with the seed planning software [employer] Prostate vs. 4.2.21. This was used to develop the seed plan including the number of needles as well as number of seeds required for complete and adequate coverage. Real-time ultrasonography was then used along with the previously developed plan and the Nucletron device to implant a total of 57 seeds using 24 needles. This proceeded without difficulty or complication.  A Foley catheter was then removed as well as the transrectal ultrasound probe and rectal probe. Flexible cystoscopy was then performed using the 17 French flexible scope which revealed a normal urethra throughout its length down to the sphincter which appeared intact. The prostatic urethra  revealed bilobar hypertrophy but no evidence of obstruction, seeds, spacers or lesions. The bladder was then entered and fully and systematically inspected. The ureteral orifices were noted to be of normal configuration and position. The mucosa revealed no evidence of tumors. There were also no stones identified within the bladder. I noted no seeds or spacers on the floor of the bladder and retroflexion of the scope revealed no seeds protruding from the base of the prostate.  The cystoscope was then removed and the patient was awakened and taken to recovery room in stable and satisfactory condition. He tolerated procedure well and there were no intraoperative complications.

## 2015-04-01 NOTE — Discharge Instructions (Signed)

## 2015-04-01 NOTE — Anesthesia Preprocedure Evaluation (Addendum)
Anesthesia Evaluation  Patient identified by MRN, date of birth, ID band Patient awake    Reviewed: Allergy & Precautions, NPO status , Patient's Chart, lab work & pertinent test results  History of Anesthesia Complications Negative for: history of anesthetic complications  Airway Mallampati: I  TM Distance: >3 FB Neck ROM: Full    Dental  (+) Teeth Intact   Pulmonary neg pulmonary ROS,    breath sounds clear to auscultation       Cardiovascular negative cardio ROS   Rhythm:Regular     Neuro/Psych negative neurological ROS  negative psych ROS   GI/Hepatic negative GI ROS, Neg liver ROS,   Endo/Other  negative endocrine ROS  Renal/GU negative Renal ROS     Musculoskeletal   Abdominal   Peds  Hematology negative hematology ROS (+)   Anesthesia Other Findings   Reproductive/Obstetrics                            Anesthesia Physical Anesthesia Plan  ASA: I  Anesthesia Plan: General   Post-op Pain Management:    Induction: Intravenous  Airway Management Planned: LMA and Oral ETT  Additional Equipment: None  Intra-op Plan:   Post-operative Plan: Extubation in OR  Informed Consent: I have reviewed the patients History and Physical, chart, labs and discussed the procedure including the risks, benefits and alternatives for the proposed anesthesia with the patient or authorized representative who has indicated his/her understanding and acceptance.   Dental advisory given  Plan Discussed with: CRNA and Surgeon  Anesthesia Plan Comments:         Anesthesia Quick Evaluation

## 2015-04-03 NOTE — Progress Notes (Signed)
  Radiation Oncology         [phone] ________________________________  Name: Benjamin Fry MRN: [id number]  Date: 04/03/2015  DOB: 08/10/1940       Prostate Seed Implant  OM:BTDHRC,BULA F., MD  No ref. provider found  DIAGNOSIS: 74 y.o. gentleman with stage T1c adenocarcinoma of the prostate with a Gleason's score of 4+3 and a PSA of 19.35    ICD-9-CM ICD-10-CM   1. Prostate cancer 50 C61 DG Chest 2 View     DG Chest 2 View     Discharge patient    PROCEDURE: Insertion of radioactive I-125 seeds into the prostate gland.  RADIATION DOSE: 110 Gy, boost therapy.  TECHNIQUE: Benjamin Fry was brought to the operating room with the urologist. He was placed in the dorsolithotomy position. He was catheterized and a rectal tube was inserted. The perineum was shaved, prepped and draped. The ultrasound probe was then introduced into the rectum to see the prostate gland.  TREATMENT DEVICE: A needle grid was attached to the ultrasound probe stand and anchor needles were placed.  3D PLANNING: The prostate was imaged in 3D using a sagittal sweep of the prostate probe. These images were transferred to the planning computer. There, the prostate, urethra and rectum were defined on each axial reconstructed image. Then, the software created an optimized 3D plan and a few seed positions were adjusted. The quality of the plan was reviewed using [facility] information for the target and the following two organs at risk:  Urethra and Rectum.  Then the accepted plan was uploaded to the seed Selectron afterloading unit.  PROSTATE VOLUME STUDY:  Using transrectal ultrasound the volume of the prostate was verified to be 34.6 cc.  SPECIAL TREATMENT PROCEDURE/SUPERVISION AND HANDLING: The Nucletron FIRST system was used to place the needles under sagittal guidance. A total of 24 needles were used to deposit 57 seeds in the prostate gland. The individual seed activity was 0.389 mCi.  COMPLEX SIMULATION: At the end of  the procedure, an anterior radiograph of the pelvis was obtained to document seed positioning and count. Cystoscopy was performed to check the urethra and bladder.  MICRODOSIMETRY: At the end of the procedure, the patient was emitting 0.12 mrem/hr at 1 meter. Accordingly, he was considered safe for hospital discharge.  PLAN: The patient will return to the radiation oncology clinic for post implant CT dosimetry in three weeks.   ________________________________  Sheral Apley Tammi Klippel, M.D.

## 2015-04-04 ENCOUNTER — Encounter (HOSPITAL_BASED_OUTPATIENT_CLINIC_OR_DEPARTMENT_OTHER): Payer: Self-pay | Admitting: Urology

## 2015-04-07 ENCOUNTER — Telehealth: Payer: Self-pay | Admitting: *Deleted

## 2015-04-07 ENCOUNTER — Ambulatory Visit: Payer: Medicare Other | Admitting: Radiation Oncology

## 2015-04-07 ENCOUNTER — Ambulatory Visit: Payer: Self-pay | Admitting: Radiation Oncology

## 2015-04-07 NOTE — Telephone Encounter (Signed)
Called patient to inform of post seed appts, being moved to Dec. 1, spoke with patient and he is aware of these appts.

## 2015-04-21 ENCOUNTER — Ambulatory Visit: Payer: Self-pay | Admitting: Radiation Oncology

## 2015-04-21 ENCOUNTER — Ambulatory Visit: Payer: Medicare Other | Admitting: Radiation Oncology

## 2015-05-04 ENCOUNTER — Telehealth: Payer: Self-pay | Admitting: *Deleted

## 2015-05-04 NOTE — Telephone Encounter (Signed)
CALLED PATIENT TO REMIND OF APPTS. FOR 21-1-16, LVM FOR A RETURN CALL

## 2015-05-05 ENCOUNTER — Ambulatory Visit
Admission: RE | Admit: 2015-05-05 | Discharge: 2015-05-05 | Disposition: A | Discharge status: Home / Self Care | Payer: Medicare Other | Source: Ambulatory Visit | Attending: Radiation Oncology | Admitting: Radiation Oncology

## 2015-05-05 ENCOUNTER — Encounter: Payer: Self-pay | Admitting: Radiation Oncology

## 2015-05-05 VITALS — BP 125/73 | HR 84 | Resp 16 | Wt 205.0 lb

## 2015-05-05 DIAGNOSIS — Z510 Encounter for antineoplastic radiation therapy: Secondary | ICD-10-CM | POA: Insufficient documentation

## 2015-05-05 DIAGNOSIS — C61 Malignant neoplasm of prostate: Secondary | ICD-10-CM | POA: Diagnosis present

## 2015-05-05 NOTE — Progress Notes (Signed)
Radiation Oncology         [phone]) [phone] ________________________________  Name: Benjamin Fry  MRN: [id number]  Date: 05/05/2015  DOB: 1941/04/29  Follow-Up Visit Note  CC: Benjamin Aus., MD  Benjamin Rhodes, MD  Diagnosis:  74 y.o. gentleman with stage T1c adenocarcinoma of the prostate with a Gleason's score of 4+3 and a PSA of 19.35 with radiation treatment on 01/25/3015-02/28/2015 with 45 Gy in 25 fractions in 1.8 Gy and insertion of radioactive seeds on 02/28/2015 with 110 Gy and boost therapy.    ICD-9-CM ICD-10-CM   1. Malignant neoplasm of prostate (HCC) 185 C61     Interval Since Last Radiation: 2 months.  Narrative:  The patient returns today for routine follow-up.  He is complaining of increased urinary frequency and urinary hesitation symptoms. He filled out a questionnaire regarding urinary function today providing and overall IPSS score of 27 characterizing his symptoms as severe.  His pre-implant score was 11. He denies any bowel symptoms. Weight and vitals stable. Denies pain. Denies hematuria. Reports dysuria. Reports nocturia x 5. Reports frequency and dysuria are his biggest complaints. Denies fatigue.   ALLERGIES:  has No Known Allergies.  Meds: Current Outpatient Prescriptions  Medication Sig Dispense Refill  . Cholecalciferol (VITAMIN D3) 2000 UNITS TABS Take 1 tablet by mouth every morning.    Marland Kitchen FLUZONE HIGH-DOSE 0.5 ML SUSY ADM 0.5ML IM UTD  0  . HYDROcodone-acetaminophen (NORCO) 10-325 MG tablet Take 1-2 tablets by mouth every 4 (four) hours as needed for moderate pain. Maximum dose per 24 hours - 8 pills (Patient not taking: Reported on 05/05/2015) 20 tablet 0   No current facility-administered medications for this encounter.    Physical Findings: The patient is in no acute distress. Patient is alert and oriented.  weight is 205 lb (92.987 kg). His blood pressure is 125/73 and his pulse is 84. His respiration is 16 and oxygen saturation is 100%. .  No  significant changes.  Lab Findings: Lab Results  Component Value Date   WBC 6.6 03/25/2015   HGB 14.5 03/25/2015   HCT 40.9 03/25/2015   MCV 91.7 03/25/2015   PLT 206 03/25/2015    Radiographic Findings:  Patient underwent CT imaging in our clinic for post implant dosimetry. The CT appears to demonstrate an adequate distribution of radioactive seeds throughout the prostate gland. There no seeds in her near the rectum. I suspect the final radiation plan and dosimetry will show appropriate coverage of the prostate gland.   Impression: The patient is recovering from the effects of radiation. His urinary symptoms should gradually improve over the next 4-6 months. We talked about this today. He is encouraged by his improvement already and is otherwise please with his outcome.   Plan: Today, I spent time talking to the patient about his prostate seed implant and resolving urinary symptoms. We also talked about long-term follow-up for prostate cancer following seed implant. He understands that ongoing PSA determinations and digital rectal exams will help perform surveillance to rule out disease recurrence. He understands what to expect with his PSA measures. Patient was also educated today about some of the long-term effects from radiation including a small risk for rectal bleeding and possibly erectile dysfunction. We talked about some of the general management approaches to these potential complications. However, I did encourage the patient to contact our office or return at any point if he has questions or concerns related to his previous radiation and prostate cancer.  _____________________________________  Sheral Apley.  Tammi Klippel, M.D.     This document serves as a record of services personally performed by Tyler Pita, MD. It was created on his behalf by Lendon Collar, a trained medical scribe. The creation of this record is based on the scribe's personal observations and the provider's  statements to them. This document has been checked and approved by the attending provider.

## 2015-05-05 NOTE — Progress Notes (Signed)
Weight and vitals stable. Denies pain. Denies hematuria. Reports dysuria. Reports nocturia x 5. Reports frequency and dysuria are his biggest complaints. Denies fatigue. Post seed IPSS 27.  BP 125/73 mmHg  Pulse 84  Resp 16  Wt 205 lb (92.987 kg)  SpO2 100% Wt Readings from Last 3 Encounters:  05/05/15 205 lb (92.987 kg)  04/01/15 198 lb 8 oz (90.039 kg)  02/28/15 203 lb 1.6 oz (92.126 kg)

## 2015-05-05 NOTE — Progress Notes (Signed)
  Radiation Oncology         [phone]) [phone] ________________________________  Name: Climmie Slovick  MRN: [id number]  Date: 05/05/2015  DOB: [birth year]  COMPLEX SIMULATION NOTE  NARRATIVE:  The patient was brought to the [facility] today following prostate seed implantation approximately one month ago.  Identity was confirmed.  All relevant records and images related to the planned course of therapy were reviewed.  Then, the patient was set-up supine.  CT images were obtained.  The CT images were loaded into the planning software.  Then the prostate and rectum were contoured.  Treatment planning then occurred.  The implanted iodine 125 seeds were identified by the physics staff for projection of radiation distribution  I have requested : 3D Simulation  I have requested a DVH of the following structures: Prostate and rectum.    ________________________________  Sheral Apley Tammi Klippel, M.D.  This document serves as a record of services personally performed by Tyler Pita, MD. It was created on his behalf by Lendon Collar, a trained medical scribe. The creation of this record is based on the scribe's personal observations and the provider's statements to them. This document has been checked and approved by the attending provider.

## 2015-05-11 NOTE — Progress Notes (Signed)
  Radiation Oncology         [phone] ________________________________  Name: Benjamin Fry MRN: [id number]  Date: 05/05/2015  DOB: [birth year]  3D Planning Note   Prostate Brachytherapy Post-Implant Dosimetry  Diagnosis: 74 y.o. gentleman with stage T1c adenocarcinoma of the prostate with a Gleason's score of 4+3 and a PSA of 19.35  Narrative: On a previous date, [name] returned following prostate seed implantation for post implant planning. He underwent CT scan complex simulation to delineate the three-dimensional structures of the pelvis and demonstrate the radiation distribution.  Since that time, the seed localization, and complex isodose planning with dose volume histograms have now been completed.  Results:   Prostate Coverage - The dose of radiation delivered to the 90% or more of the prostate gland (D90) was 98.34% of the prescription dose. This exceeds our goal of greater than 90%. Rectal Sparing - The volume of rectal tissue receiving the prescription dose or higher was 0.0 cc. This falls under our thresholds tolerance of 1.0 cc.  Impression: The prostate seed implant appears to show adequate target coverage and appropriate rectal sparing.  Plan:  The patient will continue to follow with urology for ongoing PSA determinations. I would anticipate a high likelihood for local tumor control with minimal risk for rectal morbidity.  ________________________________  Sheral Apley Tammi Klippel, M.D.

## 2015-06-02 ENCOUNTER — Telehealth: Payer: Self-pay | Admitting: *Deleted

## 2015-06-02 ENCOUNTER — Encounter: Payer: Self-pay | Admitting: Radiation Oncology

## 2015-06-02 NOTE — Telephone Encounter (Signed)
CALLED PATIENT TO INFORM THAT LETTER IS READY FOR PICK-UP, LINE BUSY WILL CALL LATER

## 2015-06-06 NOTE — Progress Notes (Signed)
  Radiation Oncology         [phone]) [phone] ________________________________  Name: Trueman Holdman MRN: [id number]  Date: 06/02/2015 DOB: 09/13/1940  3D Planning Note   Additional information added to plan in [place] in rad-onc with [facility]'s  ________________________________  Sheral Apley. Tammi Klippel, M.D.

## 2015-07-28 ENCOUNTER — Telehealth: Payer: Self-pay | Admitting: *Deleted

## 2015-07-28 ENCOUNTER — Telehealth: Payer: Self-pay | Admitting: Radiation Oncology

## 2015-07-28 NOTE — Telephone Encounter (Signed)
Called patient to inform of fu with Dr. Tammi Klippel on 08-04-15 @ 9 am, lvm for a return call

## 2015-07-28 NOTE — Telephone Encounter (Signed)
LM for the patient to call us back if he has questions.

## 2015-07-29 ENCOUNTER — Telehealth: Payer: Self-pay | Admitting: Radiation Oncology

## 2015-07-29 NOTE — Telephone Encounter (Signed)
-----   Message from Kerri Perches sent at 07/28/2015  1:10 PM EST ----- Regarding: PHONE CALL Hi Dr. Tammi Klippel,   This patient has called and said that he has some questions.  Lyn Withem phone number is [phone].  Thanks,  [employer]

## 2015-07-29 NOTE — Telephone Encounter (Signed)
Phoned patient per Dr. Johny Shears request. Patient states, "I have a few questions I would prefer to ask Dr. Tammi Klippel." Arranged follow up with Dr. Tammi Klippel for 08/18/15 at 0900 with since the patient will be out of town until until then. Patient denies any urgent needs at this time. Patient expressed appreciation for the call.

## 2015-08-04 ENCOUNTER — Ambulatory Visit: Payer: Self-pay | Admitting: Radiation Oncology

## 2015-08-18 ENCOUNTER — Ambulatory Visit
Admission: RE | Admit: 2015-08-18 | Discharge: 2015-08-18 | Disposition: A | Discharge status: Home / Self Care | Payer: Medicare Other | Source: Ambulatory Visit | Attending: Radiation Oncology | Admitting: Radiation Oncology

## 2015-08-18 VITALS — BP 118/70 | HR 103 | Temp 98.0°F | Resp 16 | Wt 211.5 lb

## 2015-08-18 DIAGNOSIS — C61 Malignant neoplasm of prostate: Secondary | ICD-10-CM

## 2015-08-18 NOTE — Progress Notes (Signed)
Radiation Oncology         [phone] ________________________________  Name: Benjamin Fry MRN: [id number]  Date: 08/18/2015  DOB: 1940/07/25  Follow-Up Visit Note  CC: Rusty Aus, MD  [name], MD  Diagnosis: 75 y.o. gentleman with stage T1c adenocarcinoma of the prostate with a Gleason's score of 4+3 and a PSA of 19.35    ICD-9-CM ICD-10-CM   1. Malignant neoplasm of prostate ([place]) 185 C61     Interval Since Last Radiation: 5 months  04/01/2015: Insertion of radioactive I-125 seeds into the prostate gland. 110 Gy, boost therapy.  01/25/2015-02/28/2015: The prostate was treated to 45 Gy in 25 fractions of 1.8 Gy  Narrative:  The patient returns today for routine follow-up. He denies pain, hematuria, dysuria, or fatigue. Reports nocturia x3. He is not taking Flomax. Reports intermittent incontinence for which he wears Depend underwear. Reports intermittent urine stream with post void dribble. He also reports hot flashes. He also reports a PSA level of 0.12 in February. Denies fatigue. He was seen by Dr. Karsten Ro in February. Office notes requested from Dr. Karsten Ro. Reports he has been given samples such as Proscar. Patient is concerned about persistent symptoms and wants my opinion regarding this. Worthy Flank, PA-C was also present during this encounter.   ALLERGIES:  has No Known Allergies.  Meds: Current Outpatient Prescriptions  Medication Sig Dispense Refill  . Cholecalciferol (VITAMIN D3) 2000 UNITS TABS Take 1 tablet by mouth every morning.    Marland Kitchen FLUZONE HIGH-DOSE 0.5 ML SUSY Reported on 08/18/2015  0  . HYDROcodone-acetaminophen (NORCO) 10-325 MG tablet Take 1-2 tablets by mouth every 4 (four) hours as needed for moderate pain. Maximum dose per 24 hours - 8 pills (Patient not taking: Reported on 05/05/2015) 20 tablet 0  . tamsulosin (FLOMAX) 0.4 MG CAPS capsule Take 1 capsule (0.4 mg total) by mouth at bedtime. 30 capsule 5   No current facility-administered  medications for this encounter.    Physical Findings: The patient is in no acute distress. Patient is alert and oriented.  weight is 211 lb 8 oz (95.936 kg). His oral temperature is 98 F (36.7 C). His blood pressure is 118/70 and his pulse is 103. His respiration is 16 and oxygen saturation is 100%.   No significant changes.  Lab Findings: Lab Results  Component Value Date   WBC 6.6 03/25/2015   HGB 14.5 03/25/2015   HCT 40.9 03/25/2015   MCV 91.7 03/25/2015   PLT 206 03/25/2015    Radiographic Findings:  Patient underwent CT imaging in our clinic for post implant dosimetry. The CT appears to demonstrate an adequate distribution of radioactive seeds throughout the prostate gland. There no seeds in her near the rectum. I suspect the final radiation plan and dosimetry will show appropriate coverage of the prostate gland.   Impression: The patient is recovering from the effects of radiation. His urinary symptoms should gradually improve over the next 4-6 months. We talked about this today. He is encouraged by his improvement already and is otherwise please with his outcome.  Plan: Today, I spent time talking to the patient about his prostate seed implant and resolving urinary symptoms. We also talked about long-term follow-up for prostate cancer following seed implant. He understands that ongoing PSA determinations and digital rectal exams will help perform surveillance to rule out disease recurrence. He understands what to expect with his PSA measures. Patient was also educated today about some of the long-term effects from radiation including a  small risk for rectal bleeding and possibly erectile dysfunction. We talked about some of the general management approaches to these potential complications. However, I did encourage the patient to contact our office or return at any point if he has questions or concerns related to his previous radiation and prostate cancer.  I will prescribe Flomax  to help relieve the patient's urinary symptoms.  _____________________________________  Sheral Apley Tammi Klippel, M.D.  This document serves as a record of services personally performed by Tyler Pita, MD. It was created on his behalf by Darcus Austin, a trained medical scribe. The creation of this record is based on the scribe's personal observations and the provider's statements to them. This document has been checked and approved by the attending provider.

## 2015-08-18 NOTE — Progress Notes (Signed)
Weight and vitals stable. Denies pain. Reports nocturia every two hours. Denies hematuria or dysuria. Reports intermittent incontinence for which he wears a depends. Reports intermittent urine stream with post void dribble. Denies fatigue. Seen by Karsten Ro in February. Office notes requested from [place]. Reports he has been given samples such as Proscar. Patient is concerned about persistent symptoms and wants Dr. Johny Shears opinion.   BP 118/70 mmHg  Pulse 103  Temp(Src) 98 F (36.7 C) (Oral)  Resp 16  Wt 211 lb 8 oz (95.936 kg)  SpO2 100% Wt Readings from Last 3 Encounters:  08/18/15 211 lb 8 oz (95.936 kg)  05/05/15 205 lb (92.987 kg)  04/01/15 198 lb 8 oz (90.039 kg)

## 2015-08-19 ENCOUNTER — Other Ambulatory Visit: Payer: Self-pay | Admitting: Radiation Oncology

## 2015-08-19 ENCOUNTER — Telehealth: Payer: Self-pay | Admitting: Radiation Oncology

## 2015-08-19 MED ORDER — TAMSULOSIN HCL 0.4 MG PO CAPS: 0.4000 mg | ORAL_CAPSULE | Freq: Every day | ORAL | Status: AC

## 2015-08-19 NOTE — Telephone Encounter (Signed)
Per Dr. Johny Shears order called in Flomax 0.4 mg capsule, qty 44 with 5 refills to [facility] in [place], [place]. Then, phoned the patient making him aware this was done. Patient verbalized understanding and expressed appreciation for the call.

## 2016-09-11 IMAGING — NM NM BONE WHOLE BODY
2 series · 2 of 2 positions shown · non-contrast
Comparison: None.

CLINICAL DATA: Prostate cancer.

EXAM:
NUCLEAR MEDICINE WHOLE BODY BONE SCAN
TECHNIQUE: Whole body anterior and posterior images were obtained approximately
3 hours after intravenous injection of radiopharmaceutical.
RADIOPHARMACEUTICALS:  26.5 mCi Dechnetium-11m MDP IV

[Series 1: wbr_bone_60 whole body · 2.66mm/px · 1 of 1 slices shown (1 of 2)]
[im 1/1]
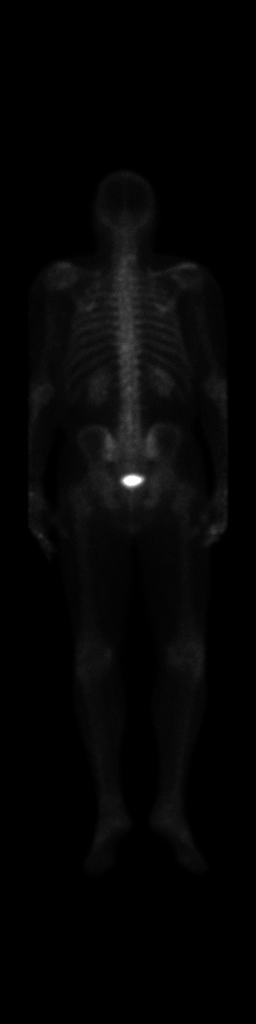

[Series 1: wbr_bone_60 whole body · 2.66mm/px · 1 of 1 slices shown (2 of 2)]
[im 1/1]
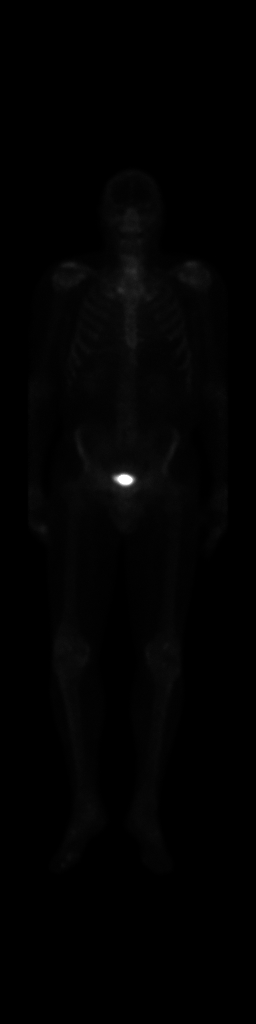

[2 of 2 positions shown; findings below may reference images not displayed]

FINDINGS: Bilateral renal function and excretion. Increased activity noted
over the region of the right sternoclavicular joint. This may be
degenerative. Sternoclavicular series suggested for further
evaluation. Diffuse cervicothoracic and thoracolumbar multifocal
mild areas of increased activity, most likely degenerative.
Cervical, thoracic, lumbar spine series can be obtained to further
evaluate for metastatic disease. Mild bilateral increased activity
noted about the shoulders and knees, most likely degenerative.
IMPRESSION: 1. Prominent increased activity noted at the level of the right
sternoclavicular joint. This finding is most likely secondary to
degenerative change. Sternoclavicular series suggested for further
evaluation.
2. Multifocal areas of slight increased activity noted throughout
the cervical, thoracic and lumbar spine, most likely degenerative,
however cervical, thoracic, and lumbar spine series can be obtained
to further evaluate for metastatic disease.

## 2016-09-25 IMAGING — CR DG THORACIC SPINE 2V
2 series · 2 of 2 positions shown · non-contrast
Comparison: Bone scan 10/12/2014.

CLINICAL DATA: Prostate neoplasm.

EXAM:
THORACIC SPINE - 2 VIEW

[t t-spine a.p.]
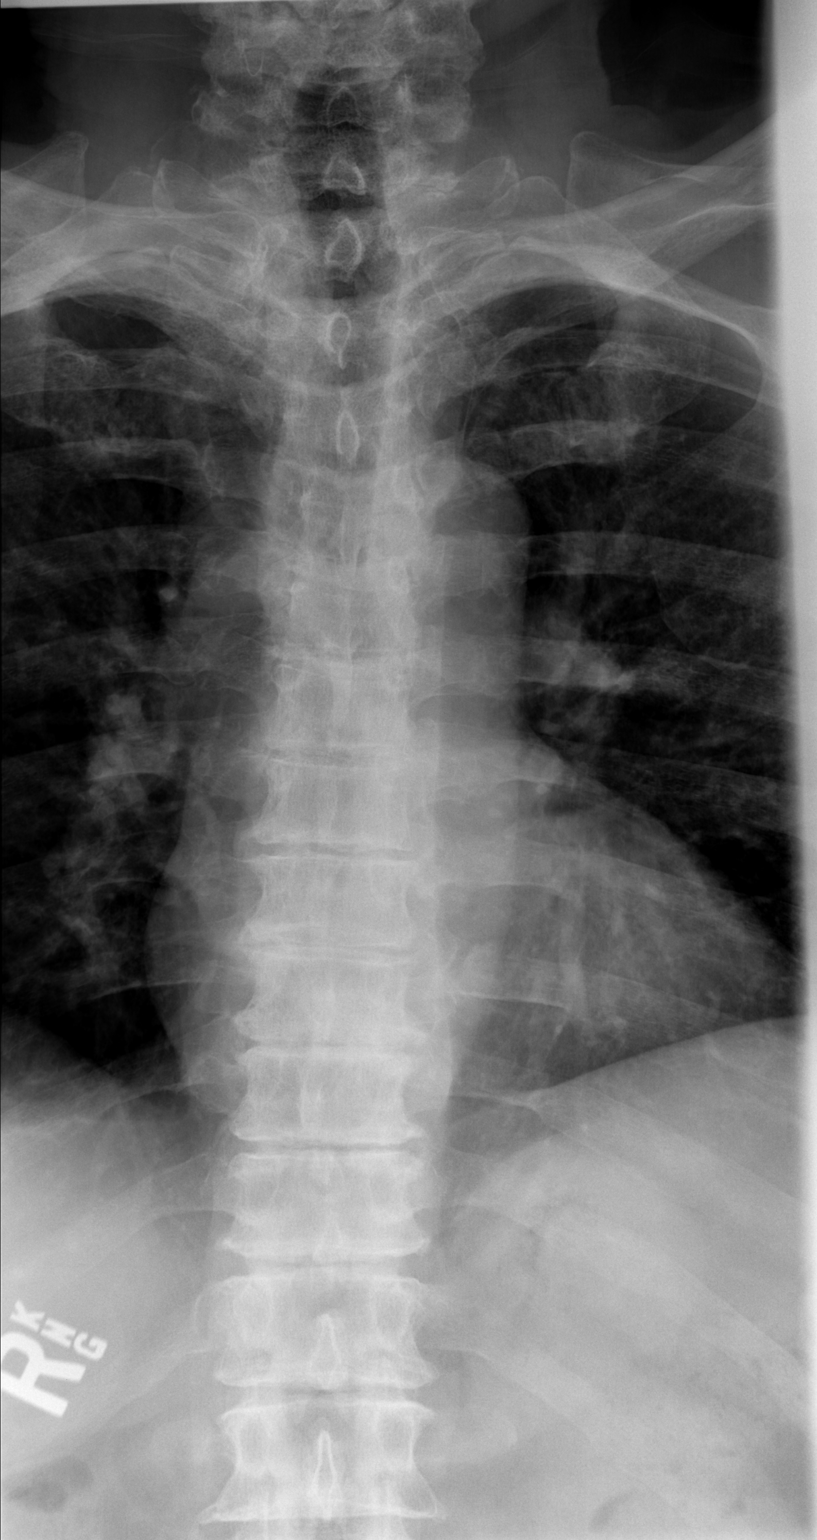

[t t-spine lat]
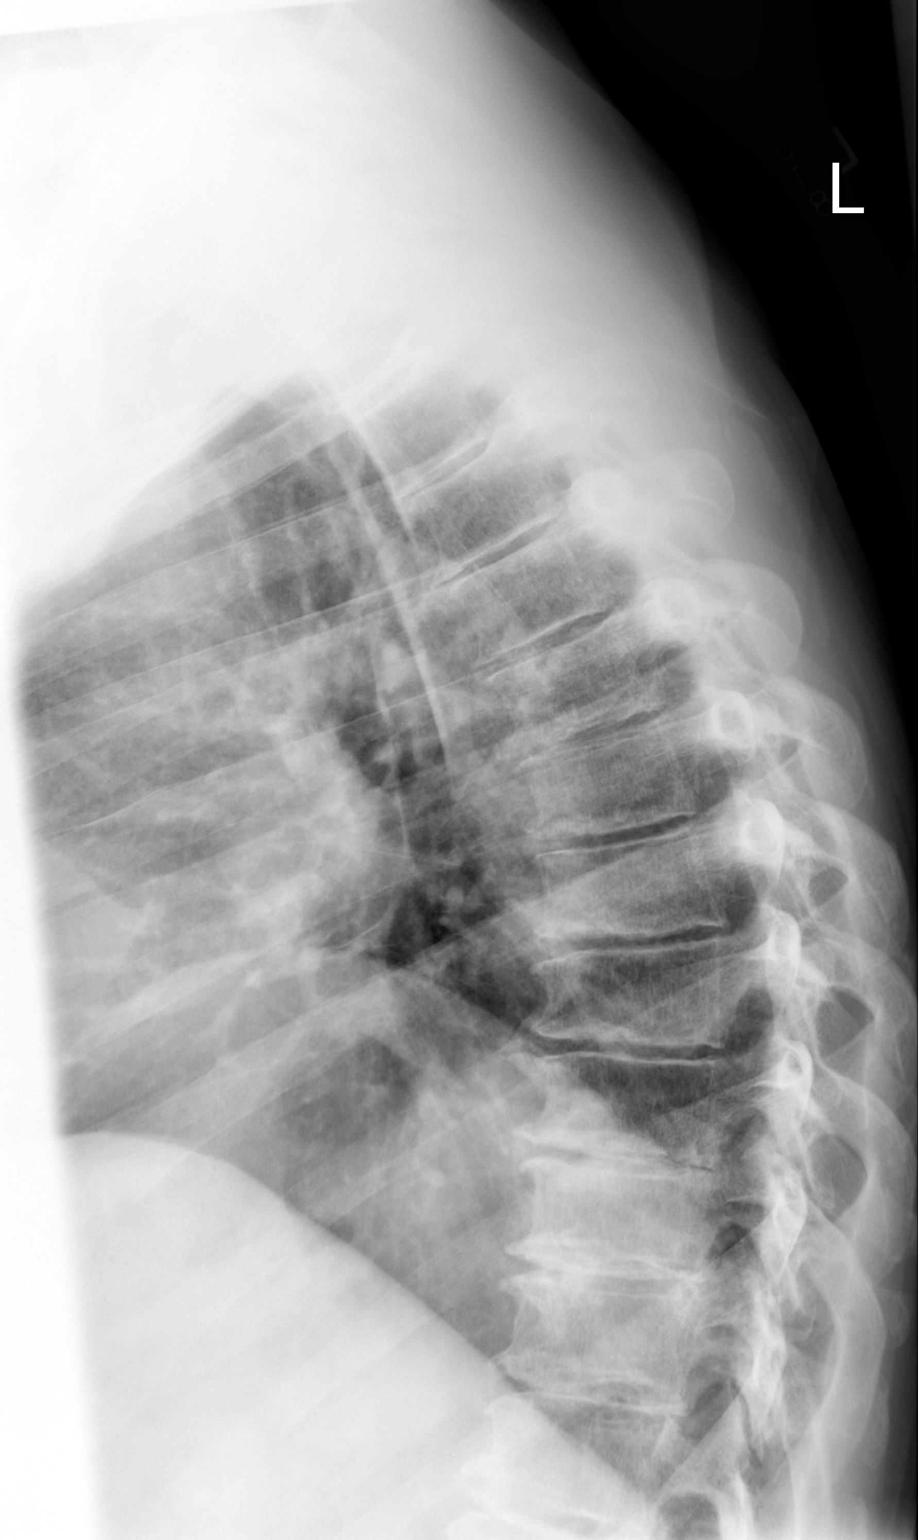

[2 of 2 positions shown; findings below may reference images not displayed]

FINDINGS: Diffuse multilevel degenerative change noted throughout the thoracic
spine. Pedicles are intact. No focal or acute abnormality
identified.
IMPRESSION: Diffuse degenerative changes thoracic spine. This most likely
accounts for abnormal bone scan activity on recent bone scan of
10/12/2014.
